# Patient Record
Sex: Female | Born: 1970 | Race: Black or African American | Hispanic: No | Marital: Married | Smoking: Never smoker
Health system: Southern US, Community
[De-identification: ages and names within clinical notes are randomized; demographics above are authoritative.]

## PROBLEM LIST (undated history)

## (undated) DIAGNOSIS — K219 Gastro-esophageal reflux disease without esophagitis: Secondary | ICD-10-CM

## (undated) DIAGNOSIS — K5190 Ulcerative colitis, unspecified, without complications: Secondary | ICD-10-CM

## (undated) DIAGNOSIS — I10 Essential (primary) hypertension: Secondary | ICD-10-CM

## (undated) DIAGNOSIS — E782 Mixed hyperlipidemia: Secondary | ICD-10-CM

## (undated) HISTORY — DX: Ulcerative colitis, unspecified, without complications: K51.90

## (undated) HISTORY — DX: Morbid (severe) obesity due to excess calories: E66.01

## (undated) HISTORY — DX: Mixed hyperlipidemia: E78.2

## (undated) HISTORY — PX: CHOLECYSTECTOMY: SHX55

## (undated) HISTORY — PX: TUBAL LIGATION: SHX77

---

## 1994-06-28 HISTORY — PX: TUBAL LIGATION: SHX77

## 2006-10-19 ENCOUNTER — Emergency Department (HOSPITAL_COMMUNITY): Admission: EM | Admit: 2006-10-19 | Discharge: 2006-10-19 | Payer: Self-pay | Admitting: Emergency Medicine

## 2010-08-29 ENCOUNTER — Emergency Department (HOSPITAL_BASED_OUTPATIENT_CLINIC_OR_DEPARTMENT_OTHER)
Admission: EM | Admit: 2010-08-29 | Discharge: 2010-08-30 | Disposition: A | Discharge status: Home / Self Care | Payer: 59 | Attending: Emergency Medicine | Admitting: Emergency Medicine

## 2010-08-29 ENCOUNTER — Emergency Department (INDEPENDENT_AMBULATORY_CARE_PROVIDER_SITE_OTHER): Payer: 59

## 2010-08-29 DIAGNOSIS — R110 Nausea: Secondary | ICD-10-CM | POA: Insufficient documentation

## 2010-08-29 DIAGNOSIS — R079 Chest pain, unspecified: Secondary | ICD-10-CM

## 2010-08-29 DIAGNOSIS — E669 Obesity, unspecified: Secondary | ICD-10-CM | POA: Insufficient documentation

## 2010-08-29 DIAGNOSIS — Z79899 Other long term (current) drug therapy: Secondary | ICD-10-CM | POA: Insufficient documentation

## 2010-08-29 DIAGNOSIS — E785 Hyperlipidemia, unspecified: Secondary | ICD-10-CM | POA: Insufficient documentation

## 2010-12-31 ENCOUNTER — Emergency Department (HOSPITAL_BASED_OUTPATIENT_CLINIC_OR_DEPARTMENT_OTHER)
Admission: EM | Admit: 2010-12-31 | Discharge: 2011-01-01 | Disposition: A | Discharge status: Home / Self Care | Payer: 59 | Attending: Emergency Medicine | Admitting: Emergency Medicine

## 2010-12-31 DIAGNOSIS — R599 Enlarged lymph nodes, unspecified: Secondary | ICD-10-CM | POA: Insufficient documentation

## 2010-12-31 DIAGNOSIS — E785 Hyperlipidemia, unspecified: Secondary | ICD-10-CM | POA: Insufficient documentation

## 2010-12-31 DIAGNOSIS — M7989 Other specified soft tissue disorders: Secondary | ICD-10-CM | POA: Insufficient documentation

## 2011-01-13 ENCOUNTER — Emergency Department (HOSPITAL_COMMUNITY)
Admission: EM | Admit: 2011-01-13 | Discharge: 2011-01-13 | Disposition: A | Discharge status: Home / Self Care | Payer: 59 | Attending: Emergency Medicine | Admitting: Emergency Medicine

## 2011-01-13 ENCOUNTER — Emergency Department (HOSPITAL_COMMUNITY): Payer: 59

## 2011-01-13 DIAGNOSIS — F411 Generalized anxiety disorder: Secondary | ICD-10-CM | POA: Insufficient documentation

## 2011-01-13 DIAGNOSIS — R000 Tachycardia, unspecified: Secondary | ICD-10-CM | POA: Insufficient documentation

## 2011-01-13 DIAGNOSIS — M542 Cervicalgia: Secondary | ICD-10-CM | POA: Insufficient documentation

## 2011-01-13 DIAGNOSIS — R220 Localized swelling, mass and lump, head: Secondary | ICD-10-CM | POA: Insufficient documentation

## 2011-01-13 LAB — POCT I-STAT, CHEM 8
BUN: 13 mg/dL (ref 6–23)
Chloride: 104 mEq/L (ref 96–112)
HCT: 36 % (ref 36.0–46.0)
Sodium: 140 mEq/L (ref 135–145)
TCO2: 25 mmol/L (ref 0–100)

## 2011-01-13 LAB — CBC
HCT: 31.5 % — ABNORMAL LOW (ref 36.0–46.0)
Hemoglobin: 9.8 g/dL — ABNORMAL LOW (ref 12.0–15.0)
MCH: 18.8 pg — ABNORMAL LOW (ref 26.0–34.0)
MCHC: 31.1 g/dL (ref 30.0–36.0)
MCV: 60.6 fL — ABNORMAL LOW (ref 78.0–100.0)

## 2011-01-13 LAB — DIFFERENTIAL
Band Neutrophils: 0 % (ref 0–10)
Basophils Absolute: 0 10*3/uL (ref 0.0–0.1)
Basophils Relative: 0 % (ref 0–1)
Blasts: 0 %
Eosinophils Absolute: 0.4 10*3/uL (ref 0.0–0.7)
Lymphs Abs: 2.2 10*3/uL (ref 0.7–4.0)
Metamyelocytes Relative: 0 %
Monocytes Relative: 7 % (ref 3–12)
Neutro Abs: 4.5 10*3/uL (ref 1.7–7.7)

## 2011-01-14 LAB — T4, FREE: Free T4: 1.17 ng/dL (ref 0.80–1.80)

## 2012-03-04 ENCOUNTER — Encounter (HOSPITAL_BASED_OUTPATIENT_CLINIC_OR_DEPARTMENT_OTHER): Payer: Self-pay | Admitting: *Deleted

## 2012-03-04 ENCOUNTER — Emergency Department (HOSPITAL_BASED_OUTPATIENT_CLINIC_OR_DEPARTMENT_OTHER)
Admission: EM | Admit: 2012-03-04 | Discharge: 2012-03-04 | Disposition: A | Discharge status: Home / Self Care | Payer: 59 | Attending: Emergency Medicine | Admitting: Emergency Medicine

## 2012-03-04 ENCOUNTER — Emergency Department (HOSPITAL_BASED_OUTPATIENT_CLINIC_OR_DEPARTMENT_OTHER): Payer: 59

## 2012-03-04 DIAGNOSIS — E785 Hyperlipidemia, unspecified: Secondary | ICD-10-CM | POA: Insufficient documentation

## 2012-03-04 DIAGNOSIS — R079 Chest pain, unspecified: Secondary | ICD-10-CM

## 2012-03-04 DIAGNOSIS — I10 Essential (primary) hypertension: Secondary | ICD-10-CM | POA: Insufficient documentation

## 2012-03-04 DIAGNOSIS — D509 Iron deficiency anemia, unspecified: Secondary | ICD-10-CM | POA: Insufficient documentation

## 2012-03-04 DIAGNOSIS — E119 Type 2 diabetes mellitus without complications: Secondary | ICD-10-CM | POA: Insufficient documentation

## 2012-03-04 DIAGNOSIS — R0789 Other chest pain: Secondary | ICD-10-CM | POA: Insufficient documentation

## 2012-03-04 DIAGNOSIS — K219 Gastro-esophageal reflux disease without esophagitis: Secondary | ICD-10-CM | POA: Insufficient documentation

## 2012-03-04 LAB — CBC WITH DIFFERENTIAL/PLATELET
Band Neutrophils: 0 % (ref 0–10)
Basophils Absolute: 0 10*3/uL (ref 0.0–0.1)
Basophils Relative: 0 % (ref 0–1)
Eosinophils Absolute: 0.5 10*3/uL (ref 0.0–0.7)
Eosinophils Relative: 8 % — ABNORMAL HIGH (ref 0–5)
HCT: 28.5 % — ABNORMAL LOW (ref 36.0–46.0)
Hemoglobin: 9 g/dL — ABNORMAL LOW (ref 12.0–15.0)
MCH: 17.6 pg — ABNORMAL LOW (ref 26.0–34.0)
MCHC: 31.6 g/dL (ref 30.0–36.0)
MCV: 55.7 fL — ABNORMAL LOW (ref 78.0–100.0)
Metamyelocytes Relative: 0 %
Myelocytes: 0 %
Promyelocytes Absolute: 0 %
RBC: 5.12 MIL/uL — ABNORMAL HIGH (ref 3.87–5.11)

## 2012-03-04 MED ORDER — ACETAMINOPHEN 325 MG PO TABS
650.0000 mg | ORAL_TABLET | Freq: Once | ORAL | Status: AC
  Administered 2012-03-04: 650 mg via ORAL
  Filled 2012-03-04: qty 2

## 2012-03-04 MED ORDER — ESOMEPRAZOLE MAGNESIUM 40 MG PO CPDR: 40.0000 mg | DELAYED_RELEASE_CAPSULE | Freq: Every day | ORAL | Status: DC

## 2012-03-04 MED ORDER — GI COCKTAIL ~~LOC~~
30.0000 mL | Freq: Once | ORAL | Status: AC
  Administered 2012-03-04: 30 mL via ORAL
  Filled 2012-03-04: qty 30

## 2012-03-04 MED ORDER — ONDANSETRON 8 MG PO TBDP
8.0000 mg | ORAL_TABLET | Freq: Once | ORAL | Status: AC
  Administered 2012-03-04: 8 mg via ORAL
  Filled 2012-03-04: qty 1

## 2012-03-04 MED ORDER — FERROUS SULFATE 325 (65 FE) MG PO TABS: 325.0000 mg | ORAL_TABLET | Freq: Every day | ORAL | Status: DC

## 2012-03-04 NOTE — ED Provider Notes (Signed)
History   This chart was scribed for Suzanne Numbers, MD scribed by Magnus Sinning. The patient was seen in room MH05/MH05 at 17:53   CSN: [id number]  Arrival date & time 03/04/12  1653     Chief Complaint  Patient presents with  . Chest Pain    (Consider location/radiation/quality/duration/timing/severity/associated sxs/prior treatment) HPI Suzanne Armstrong is a 41 y.o. female who presents to the Emergency Department complaining of constant moderate CP, onset 2 days ago, but worsening today with associated lightheadedness, severe HA, nausea, and a burning sensatio. States she was just cooking dinner when her CP began and currently rates the pain a 7/10 and describes the pain as a heart burn. Explains she has only taken Mylanta this morning, with no relief, and says she has only eaten chicken noodle soup today. She denies vomiting, change in pain with deep breathing, hx of DM, HTN, hyperlipidemia, recent sick contact exposure, fever, cough, or family hx of MI before 50. Patient does report hx of anemia and heavy periods, but states she has never had to receive blood for treatment. Patient states she is not a smoker and explains her husband does smoke, but not in the home.    History reviewed. No pertinent past medical history.  Past Surgical History  Procedure Date  . Cholecystectomy   . Tubal ligation     History reviewed. No pertinent family history.  History  Substance Use Topics  . Smoking status: Never Smoker   . Smokeless tobacco: Not on file  . Alcohol Use: No   Review of Systems  Constitutional: Negative for fever.  HENT: Positive for sore throat.   Respiratory: Negative for cough.   Cardiovascular: Positive for chest pain.  Gastrointestinal: Positive for nausea. Negative for vomiting.  Neurological: Positive for light-headedness and headaches.  All other systems reviewed and are negative.    Allergies  Other and Strawberry  Home Medications   Current Outpatient  Rx  Name Route Sig Dispense Refill  . ALUM & MAG HYDROXIDE-SIMETH 200-200-20 MG/5ML PO SUSP Oral Take 15 mLs by mouth every 6 (six) hours as needed. FOR INDIGESTION    . ASPIRIN-ACETAMINOPHEN-CAFFEINE 250-250-65 MG PO TABS Oral Take 1 tablet by mouth every 6 (six) hours as needed. FOR HEADACHE    . ESOMEPRAZOLE MAGNESIUM 40 MG PO CPDR Oral Take 40 mg by mouth daily.      BP 133/92  Pulse 92  Temp 97.9 F (36.6 C) (Oral)  Resp 20  Ht 5' (1.524 m)  Wt 185 lb (83.915 kg)  BMI 36.13 kg/m2  SpO2 98%  LMP 02/20/2012  Physical Exam  Nursing note and vitals reviewed. GEN: Well-developed, well-nourished female  HEENT: Atraumatic, normocephalic. Oropharynx clear without erythema EYES: PERRLA BL, no scleral icterus. NECK: Trachea midline, no meningismus CV: regular rate and rhythm. No murmurs, rubs, or gallops PULM: No respiratory distress.  No crackles, wheezes, or rales. GI: soft, non-tender. No guarding, rebound, or tenderness. + bowel sounds  GU: deferred Neuro: cranial nerves grossly 2-12 intact, no abnormalities of strength or sensation, A and O x 3 MSK: Patient moves all 4 extremities symmetrically, no deformity, edema, or injury noted Skin: No rashes petechiae, purpura, or jaundice Psych: no abnormality of mood  ED Course  Procedures (including critical care time)  Indication: chest pain Please note this EKG was reviewed extemporaneously by myself.   Date: 03/05/2012  Rate: 97  Rhythm: normal sinus rhythm  QRS Axis: normal  Intervals: normal  ST/T Wave abnormalities: nonspecific T wave  changes  Conduction Disutrbances:none  Narrative Interpretation: T wave inversion in aVL  Old EKG Reviewed: none available       DIAGNOSTIC STUDIES: Oxygen Saturation is 98% on room air, normal by my interpretation.    COORDINATION OF CARE: 17:56: Provides intent to treat as a reflux, due to information provided. Orders placed for 8 mg Zofran ODT and Gi Cocktail. Patient agreeable  at this time. 17:57: Physical exam performed   Labs Reviewed  CBC WITH DIFFERENTIAL   Dg Chest 2 View  03/04/2012  *RADIOLOGY REPORT*  Clinical Data: 41 year old female right side chest pain.  CHEST - 2 VIEW  Comparison: 08/29/2010.  Findings: Stable lung volumes.  Cardiac size and mediastinal contours are within normal limits.  Visualized tracheal air column is within normal limits.  The lungs remain clear aside from mild crowding markings.  No pneumothorax or effusion. No osseous abnormality identified.  IMPRESSION: No acute cardiopulmonary abnormality.   Original Report Authenticated By: Ulla Potash III, M.D.      1. GERD (gastroesophageal reflux disease)   2. Microcytic anemia   3. Chest pain       MDM  Patient was evaluated by myself and found to have presentation most consistent with GERD.  ECG showed no findings concerning for atypical presentation of ACS and CXR was without PNA or cardiomegaly.  Symptoms were treated with GI cocktail and zofran with improvement but without complete resolution.  CBC showed anemia and last lab was noted to have Hbg of 9.8 in 12/2010 while today's value was 9.0.  Patient was not felt to have symptoms as a result of symptomatic anemia.  Given low MCV iron supplement prescribed.  Patient prescribed nexium as she has had improvement with this in the past and no improvement with OTC pepcid.  Patient advised to follow-up with a PCP and d/c'ed in good condition.    I personally performed the services described in this documentation, which was scribed in my presence. The recorded information has been reviewed and considered.          Suzanne Numbers, MD 03/09/12 [phone]

## 2012-03-04 NOTE — ED Notes (Addendum)
Pt states her throat has been burning and she has ahd epigastric discomfort x 1 week. Took some of her mom's Nexium when this happened before and it helped. Stopped taking it though and s/s returned. Now C/O CP, SHOB and weakness this week. EKG being done at triage.

## 2013-01-15 ENCOUNTER — Encounter (HOSPITAL_BASED_OUTPATIENT_CLINIC_OR_DEPARTMENT_OTHER): Payer: Self-pay | Admitting: *Deleted

## 2013-01-15 ENCOUNTER — Emergency Department (HOSPITAL_BASED_OUTPATIENT_CLINIC_OR_DEPARTMENT_OTHER): Payer: 59

## 2013-01-15 ENCOUNTER — Emergency Department (HOSPITAL_BASED_OUTPATIENT_CLINIC_OR_DEPARTMENT_OTHER)
Admission: EM | Admit: 2013-01-15 | Discharge: 2013-01-15 | Disposition: A | Discharge status: Home / Self Care | Payer: 59 | Attending: Emergency Medicine | Admitting: Emergency Medicine

## 2013-01-15 DIAGNOSIS — J189 Pneumonia, unspecified organism: Secondary | ICD-10-CM

## 2013-01-15 DIAGNOSIS — R5381 Other malaise: Secondary | ICD-10-CM | POA: Insufficient documentation

## 2013-01-15 DIAGNOSIS — Z79899 Other long term (current) drug therapy: Secondary | ICD-10-CM | POA: Insufficient documentation

## 2013-01-15 DIAGNOSIS — J159 Unspecified bacterial pneumonia: Secondary | ICD-10-CM | POA: Insufficient documentation

## 2013-01-15 DIAGNOSIS — R5383 Other fatigue: Secondary | ICD-10-CM | POA: Insufficient documentation

## 2013-01-15 LAB — CBC WITH DIFFERENTIAL/PLATELET
Basophils Relative: 0 % (ref 0–1)
Eosinophils Absolute: 0.6 10*3/uL (ref 0.0–0.7)
Eosinophils Relative: 6 % — ABNORMAL HIGH (ref 0–5)
Hemoglobin: 9.9 g/dL — ABNORMAL LOW (ref 12.0–15.0)
Lymphocytes Relative: 27 % (ref 12–46)
MCH: 18.6 pg — ABNORMAL LOW (ref 26.0–34.0)
Monocytes Absolute: 0.5 10*3/uL (ref 0.1–1.0)
Neutrophils Relative %: 62 % (ref 43–77)
Platelets: 435 10*3/uL — ABNORMAL HIGH (ref 150–400)
RBC: 5.31 MIL/uL — ABNORMAL HIGH (ref 3.87–5.11)

## 2013-01-15 LAB — BASIC METABOLIC PANEL
CO2: 24 mEq/L (ref 19–32)
Calcium: 9.6 mg/dL (ref 8.4–10.5)
GFR calc non Af Amer: >90 mL/min (ref >90)
Glucose, Bld: 109 mg/dL — ABNORMAL HIGH (ref 70–99)
Potassium: 3.6 mEq/L (ref 3.5–5.1)
Sodium: 135 mEq/L (ref 135–145)

## 2013-01-15 LAB — PRO B NATRIURETIC PEPTIDE: Pro B Natriuretic peptide (BNP): 8 pg/mL (ref 0–125)

## 2013-01-15 LAB — TROPONIN I: Troponin I: <0.3 ng/mL (ref <0.30)

## 2013-01-15 LAB — D-DIMER, QUANTITATIVE: D-Dimer, Quant: 1.79 ug/mL-FEU — ABNORMAL HIGH (ref 0.00–0.48)

## 2013-01-15 MED ORDER — ACETAMINOPHEN 325 MG PO TABS
ORAL_TABLET | ORAL | Status: AC
  Filled 2013-01-15: qty 2

## 2013-01-15 MED ORDER — IOHEXOL 350 MG/ML SOLN
100.0000 mL | Freq: Once | INTRAVENOUS | Status: AC | PRN
  Administered 2013-01-15: 100 mL via INTRAVENOUS

## 2013-01-15 MED ORDER — ACETAMINOPHEN 325 MG PO TABS
650.0000 mg | ORAL_TABLET | Freq: Once | ORAL | Status: AC
  Administered 2013-01-15: 650 mg via ORAL

## 2013-01-15 MED ORDER — DEXTROSE 5 % IV SOLN
1.0000 g | Freq: Once | INTRAVENOUS | Status: AC
  Administered 2013-01-15: 1 g via INTRAVENOUS
  Filled 2013-01-15: qty 10

## 2013-01-15 MED ORDER — AZITHROMYCIN 250 MG PO TABS: ORAL_TABLET | ORAL | Status: DC

## 2013-01-15 NOTE — ED Provider Notes (Signed)
History     This chart was scribed for Rolan Bucco, MD by Jiles Prows, ED Scribe. The patient was seen in room MH10/MH10 and the patient's care was started at 6:50 PM.   CSN: [id number] Arrival date & time 01/15/13  1830   Chief Complaint  Patient presents with  . Shortness of Breath   The history is provided by the patient and medical records. No language interpreter was used.   HPI Comments: Suzanne Armstrong is a 42 y.o. female who presents to the Emergency Department complaining of moderate, intermittent shortness of breath onset Friday.  She reports feeling like she cannot carry on a conversation citing "weird feeling" when she tries to breathe.  She reports exhaustion yesterday and states that she was afraid to go to sleep last night.  Pt complains of exacerbation with exertion.  She denies asthma, cough, URI symptoms, or swelling in legs, pain in calves, recent travel, recent surgery, headache, diaphoresis, fever, chills, nausea, vomiting, diarrhea, weakness, cough, SOB and any other pain.  Pt is speaking in complete sentences.    Pt reports MRI in April that showed cysts on ovaries.  History reviewed. No pertinent past medical history. Past Surgical History  Procedure Laterality Date  . Cholecystectomy    . Tubal ligation     No family history on file. History  Substance Use Topics  . Smoking status: Never Smoker   . Smokeless tobacco: Not on file  . Alcohol Use: No   OB History   Grav Para Term Preterm Abortions TAB SAB Ect Mult Living                 Review of Systems  Constitutional: Positive for fatigue. Negative for fever, chills and diaphoresis.  HENT: Negative for congestion, rhinorrhea and sneezing.   Eyes: Negative.   Respiratory: Positive for shortness of breath. Negative for cough and chest tightness.   Cardiovascular: Negative for chest pain and leg swelling.  Gastrointestinal: Negative for nausea, vomiting, abdominal pain, diarrhea and blood in stool.   Genitourinary: Negative for frequency, hematuria, flank pain and difficulty urinating.  Musculoskeletal: Negative for back pain and arthralgias.  Skin: Negative for rash.  Neurological: Negative for dizziness, speech difficulty, weakness, numbness and headaches.  All other systems reviewed and are negative.    Allergies  Other and Strawberry  Home Medications   Current Outpatient Rx  Name  Route  Sig  Dispense  Refill  . alum & mag hydroxide-simeth (MAALOX/MYLANTA) 200-200-20 MG/5ML suspension   Oral   Take 15 mLs by mouth every 6 (six) hours as needed. FOR INDIGESTION         . aspirin-acetaminophen-caffeine (EXCEDRIN MIGRAINE) 250-250-65 MG per tablet   Oral   Take 1 tablet by mouth every 6 (six) hours as needed. FOR HEADACHE         . azithromycin (ZITHROMAX Z-PAK) 250 MG tablet      2 po day one, then 1 daily x 4 days   5 tablet   0   . esomeprazole (NEXIUM) 40 MG capsule   Oral   Take 40 mg by mouth daily.         Marland Kitchen esomeprazole (NEXIUM) 40 MG capsule   Oral   Take 1 capsule (40 mg total) by mouth daily.   30 capsule   0   . ferrous sulfate 325 (65 FE) MG tablet   Oral   Take 1 tablet (325 mg total) by mouth daily.   30 tablet  0    BP 137/78  Pulse 85  Temp(Src) 98.4 F (36.9 C) (Oral)  Resp 18  Ht 5' (1.524 m)  Wt 212 lb (96.163 kg)  BMI 41.4 kg/m2  SpO2 100% Physical Exam  Constitutional: She is oriented to person, place, and time. She appears well-developed and well-nourished.  HENT:  Head: Normocephalic and atraumatic.  Eyes: Pupils are equal, round, and reactive to light.  Neck: Normal range of motion. Neck supple.  Cardiovascular: Normal rate, regular rhythm and normal heart sounds.   Pulmonary/Chest: Effort normal and breath sounds normal. No respiratory distress. She has no wheezes. She has no rales. She exhibits no tenderness.  Abdominal: Soft. Bowel sounds are normal. There is no tenderness. There is no rebound and no guarding.   Musculoskeletal: Normal range of motion. She exhibits no edema.  Lymphadenopathy:    She has no cervical adenopathy.  Neurological: She is alert and oriented to person, place, and time.  Skin: Skin is warm and dry. No rash noted.  Psychiatric: She has a normal mood and affect.    ED Course  Procedures (including critical care time) DIAGNOSTIC STUDIES: Oxygen Saturation is 100% on RA, normal by my interpretation.    COORDINATION OF CARE: 6:54 PM - Discussed ED treatment with pt at bedside including chest x-ray and blood work and pt agrees.   Results for orders placed during the hospital encounter of 01/15/13  CBC WITH DIFFERENTIAL      Result Value Range   WBC 9.5  4.0 - 10.5 K/uL   RBC 5.31 (*) 3.87 - 5.11 MIL/uL   Hemoglobin 9.9 (*) 12.0 - 15.0 g/dL   HCT 16.1 (*) 09.6 - 04.5 %   MCV 58.9 (*) 78.0 - 100.0 fL   MCH 18.6 (*) 26.0 - 34.0 pg   MCHC 31.6  30.0 - 36.0 g/dL   RDW 40.9 (*) 81.1 - 91.4 %   Platelets 435 (*) 150 - 400 K/uL   Neutrophils Relative % 62  43 - 77 %   Lymphocytes Relative 27  12 - 46 %   Monocytes Relative 5  3 - 12 %   Eosinophils Relative 6 (*) 0 - 5 %   Basophils Relative 0  0 - 1 %   Neutro Abs 5.8  1.7 - 7.7 K/uL   Lymphs Abs 2.6  0.7 - 4.0 K/uL   Monocytes Absolute 0.5  0.1 - 1.0 K/uL   Eosinophils Absolute 0.6  0.0 - 0.7 K/uL   Basophils Absolute 0.0  0.0 - 0.1 K/uL   RBC Morphology POLYCHROMASIA PRESENT    BASIC METABOLIC PANEL      Result Value Range   Sodium 135  135 - 145 mEq/L   Potassium 3.6  3.5 - 5.1 mEq/L   Chloride 100  96 - 112 mEq/L   CO2 24  19 - 32 mEq/L   Glucose, Bld 109 (*) 70 - 99 mg/dL   BUN 12  6 - 23 mg/dL   Creatinine, Ser 7.82  0.50 - 1.10 mg/dL   Calcium 9.6  8.4 - 95.6 mg/dL   GFR calc non Af Amer >90  >90 mL/min   GFR calc Af Amer >90  >90 mL/min  TROPONIN I      Result Value Range   Troponin I <0.30  <0.30 ng/mL  PRO B NATRIURETIC PEPTIDE      Result Value Range   Pro B Natriuretic peptide (BNP) 8.0  0 -  125 pg/mL  D-DIMER,  QUANTITATIVE      Result Value Range   D-Dimer, Quant 1.79 (*) 0.00 - 0.48 ug/mL-FEU   Dg Chest 2 View  01/15/2013   *RADIOLOGY REPORT*  Clinical Data: Shortness of breath  CHEST - 2 VIEW  Comparison: March 04, 2012.  Findings: There is a small area of patchy infiltrate in the left base.  The lungs are otherwise clear.  Heart size and pulmonary vascularity are normal.  No pneumothorax.  No adenopathy.  No bone lesions.  IMPRESSION: Small area of infiltrate left base.  Lungs otherwise clear.   Original Report Authenticated By: Bretta Bang, M.D.   Ct Angio Chest Pe W/cm &/or Wo Cm  01/15/2013   *RADIOLOGY REPORT*  Clinical Data: Shortness of breath  CT ANGIOGRAPHY CHEST  Technique:  Multidetector CT imaging of the chest using the standard protocol during bolus administration of intravenous contrast. Multiplanar reconstructed images including MIPs were obtained and reviewed to evaluate the vascular anatomy.  Contrast: OMNIPAQUE IOHEXOL 350 MG/ML SOLN  Comparison: Chest radiograph January 15, 2013  Findings:  There is no demonstrable pulmonary embolus.  There is no thoracic aortic aneurysm or dissection.  There is patchy bibasilar infiltrate, slightly more on the left than on the right.  Elsewhere, lungs are clear.  There is no appreciable thoracic adenopathy.  There is a mild degree of residual thymic tissue, a finding that may be within normal limits.  Pericardium is not thickened. There is a small hiatal hernia.  Visualized upper abdominal structures appear within normal limits.  There are no blastic or lytic bone lesions.  There is mild degenerative change in the thoracic spine.  Thyroid appears within normal limits.  IMPRESSION: Patchy bibasilar infiltrate, slightly more on the left than on the right.  No demonstrable pulmonary embolus.  Small hiatal hernia.   Original Report Authenticated By: Bretta Bang, M.D.    Date: 01/15/2013  Rate: 82  Rhythm: normal sinus  rhythm  QRS Axis: normal  Intervals: normal  ST/T Wave abnormalities: normal  Conduction Disutrbances:none  Narrative Interpretation:   Old EKG Reviewed: unchanged      1. Community acquired pneumonia     MDM  Patient has no evidence of pulmonary embolus. Her oxygen saturation is normal. She is otherwise well-appearing and can be treated as an outpatient. She was given dose Rocephin here in the ED as well as a prescription for Zithromax. She was encouraged to followup with her primary care physician for recheck within the next week. She is going to try to get in with [facility]. She was advised to return here for symptoms worsen. She has a history of chronic anemia and her hemoglobin and is unchanged from her baseline. Her last value was 9.0. I personally performed the services described in this documentation, which was scribed in my presence.  The recorded information has been reviewed and considered.    Rolan Bucco, MD 01/15/13 2113

## 2013-01-15 NOTE — ED Notes (Signed)
States like she is having to struggle getting a breath. Symptoms started 3 days ago. Speaking in complete sentences at triage. No respiratory distress.

## 2013-06-28 HISTORY — PX: LAPAROSCOPIC CHOLECYSTECTOMY: SUR755

## 2014-04-04 ENCOUNTER — Encounter (HOSPITAL_BASED_OUTPATIENT_CLINIC_OR_DEPARTMENT_OTHER): Payer: Self-pay | Admitting: Emergency Medicine

## 2014-04-04 ENCOUNTER — Emergency Department (HOSPITAL_BASED_OUTPATIENT_CLINIC_OR_DEPARTMENT_OTHER)
Admission: EM | Admit: 2014-04-04 | Discharge: 2014-04-04 | Disposition: A | Discharge status: Home / Self Care | Payer: 59 | Attending: Emergency Medicine | Admitting: Emergency Medicine

## 2014-04-04 DIAGNOSIS — J029 Acute pharyngitis, unspecified: Secondary | ICD-10-CM | POA: Diagnosis present

## 2014-04-04 DIAGNOSIS — H9319 Tinnitus, unspecified ear: Secondary | ICD-10-CM | POA: Diagnosis not present

## 2014-04-04 DIAGNOSIS — M542 Cervicalgia: Secondary | ICD-10-CM | POA: Diagnosis not present

## 2014-04-04 DIAGNOSIS — J0191 Acute recurrent sinusitis, unspecified: Secondary | ICD-10-CM | POA: Diagnosis not present

## 2014-04-04 DIAGNOSIS — R05 Cough: Secondary | ICD-10-CM | POA: Diagnosis not present

## 2014-04-04 DIAGNOSIS — Z79899 Other long term (current) drug therapy: Secondary | ICD-10-CM | POA: Diagnosis not present

## 2014-04-04 DIAGNOSIS — J019 Acute sinusitis, unspecified: Secondary | ICD-10-CM

## 2014-04-04 LAB — RAPID STREP SCREEN (MED CTR MEBANE ONLY): Streptococcus, Group A Screen (Direct): NEGATIVE

## 2014-04-04 MED ORDER — MOMETASONE FUROATE 50 MCG/ACT NA SUSP: 2.0000 | Freq: Every day | NASAL | Status: DC

## 2014-04-04 MED ORDER — AMOXICILLIN 500 MG PO CAPS: 500.0000 mg | ORAL_CAPSULE | Freq: Three times a day (TID) | ORAL | Status: DC

## 2014-04-04 NOTE — ED Notes (Signed)
Sore throat and swelling of her neck off and on for 3 weeks.

## 2014-04-04 NOTE — Discharge Instructions (Signed)

## 2014-04-04 NOTE — ED Provider Notes (Signed)
CSN: [id number]     Arrival date & time 04/04/14  1859 History  This chart was scribed for Gilda Creasehristopher J. Lui Bellis, * by Jarvis Morganaylor Ferguson, ED Scribe. This patient was seen in room MHFT2/MHFT2 and the patient's care was started at 7:42 PM.    Chief Complaint  Patient presents with  . Sore Throat      The history is provided by the patient. No language interpreter was used.   HPI Comments: Suzanne Armstrong is a 43 y.o. female who presents to the Emergency Department complaining of intermittent, mild, sore throat for 2 weeks. Pt notes some associated congestion, cough and neck pain. Pt feels like the side of her neck is swollen. She has not taken anything for her symptoms.Pt states that the sore throat is exacerbated when she tries to eat. She denies any neck stiffness, nausea, vomiting, diarrhea, fever, chills, shortness of breath, or trouble swallowing   No past medical history on file. Past Surgical History  Procedure Laterality Date  . Cholecystectomy    . Tubal ligation     No family history on file. History  Substance Use Topics  . Smoking status: Never Smoker   . Smokeless tobacco: Not on file  . Alcohol Use: No   OB History   Grav Para Term Preterm Abortions TAB SAB Ect Mult Living                 Review of Systems  Constitutional: Negative for fever and chills.  HENT: Positive for congestion and tinnitus. Negative for trouble swallowing.   Respiratory: Positive for cough. Negative for shortness of breath.   Gastrointestinal: Negative for nausea, vomiting and diarrhea.  Musculoskeletal: Positive for neck pain. Negative for neck stiffness.  All other systems reviewed and are negative.     Allergies  Other and Strawberry  Home Medications   Prior to Admission medications   Medication Sig Start Date End Date Taking? Authorizing Provider  alum & mag hydroxide-simeth (MAALOX/MYLANTA) 200-200-20 MG/5ML suspension Take 15 mLs by mouth every 6 (six) hours as needed. FOR  INDIGESTION    Historical Provider, MD  aspirin-acetaminophen-caffeine (EXCEDRIN MIGRAINE) [phone] MG per tablet Take 1 tablet by mouth every 6 (six) hours as needed. FOR HEADACHE    Historical Provider, MD  azithromycin (ZITHROMAX Z-PAK) 250 MG tablet 2 po day one, then 1 daily x 4 days 01/15/13   Rolan BuccoMelanie Belfi, MD  esomeprazole (NEXIUM) 40 MG capsule Take 40 mg by mouth daily.    Historical Provider, MD  esomeprazole (NEXIUM) 40 MG capsule Take 1 capsule (40 mg total) by mouth daily. 03/04/12 03/04/13  Cyndra NumbersMeagan Hunt, MD  ferrous sulfate 325 (65 FE) MG tablet Take 1 tablet (325 mg total) by mouth daily. 03/04/12 03/04/13  Cyndra NumbersMeagan Hunt, MD   Triage Vitals: BP 139/94  Pulse 83  Temp(Src) 98.4 F (36.9 C) (Oral)  Resp 20  Ht 5\' 1"  (1.549 m)  Wt 215 lb (97.523 kg)  BMI 40.64 kg/m2  SpO2 100%  LMP 03/20/2014  Physical Exam  Constitutional: She is oriented to person, place, and time. She appears well-developed and well-nourished. No distress.  HENT:  Head: Normocephalic and atraumatic.  Right Ear: Hearing normal.  Left Ear: Hearing normal.  Nose: Nose normal.  Mouth/Throat: Oropharynx is clear and moist and mucous membranes are normal.  Eyes: Conjunctivae and EOM are normal. Pupils are equal, round, and reactive to light.  Neck: Normal range of motion. Neck supple.  Cardiovascular: Regular rhythm, S1 normal and S2 normal.  Exam reveals no gallop and no friction rub.   No murmur heard. Pulmonary/Chest: Effort normal and breath sounds normal. No respiratory distress. She exhibits no tenderness.  Abdominal: Soft. Normal appearance and bowel sounds are normal. There is no hepatosplenomegaly. There is no tenderness. There is no rebound, no guarding, no tenderness at McBurney's point and negative Murphy's sign. No hernia.  Musculoskeletal: Normal range of motion.  Neurological: She is alert and oriented to person, place, and time. She has normal strength. No cranial nerve deficit or sensory deficit.  Coordination normal. GCS eye subscore is 4. GCS verbal subscore is 5. GCS motor subscore is 6.  Skin: Skin is warm, dry and intact. No rash noted. No cyanosis.  Psychiatric: She has a normal mood and affect. Her speech is normal and behavior is normal. Thought content normal.    ED Course  Procedures (including critical care time)   DIAGNOSTIC STUDIES: Oxygen Saturation is 100% on RA, normal by my interpretation.    COORDINATION OF CARE: 7:58  PM- Will order rapid strep screen and culture. Pt advised of plan for treatment and pt agrees.     Labs Review Labs Reviewed  RAPID STREP SCREEN  CULTURE, GROUP A STREP    Imaging Review No results found.   EKG Interpretation None      MDM   Final diagnoses:  None   sinusitis  Presents to the ER for evaluation of intermittent sore throat for 2 weeks. Examination was unremarkable. She does report some sinus congestion. We'll empirically treat for possible sinus infection.  I personally performed the services described in this documentation, which was scribed in my presence. The recorded information has been reviewed and is accurate.     Gilda Crease, MD 04/04/14 2007

## 2014-04-06 LAB — CULTURE, GROUP A STREP

## 2015-01-22 ENCOUNTER — Emergency Department (HOSPITAL_BASED_OUTPATIENT_CLINIC_OR_DEPARTMENT_OTHER)
Admission: EM | Admit: 2015-01-22 | Discharge: 2015-01-22 | Disposition: A | Discharge status: Home / Self Care | Payer: 59 | Attending: Emergency Medicine | Admitting: Emergency Medicine

## 2015-01-22 ENCOUNTER — Emergency Department (HOSPITAL_BASED_OUTPATIENT_CLINIC_OR_DEPARTMENT_OTHER): Payer: 59

## 2015-01-22 ENCOUNTER — Encounter (HOSPITAL_BASED_OUTPATIENT_CLINIC_OR_DEPARTMENT_OTHER): Payer: Self-pay | Admitting: *Deleted

## 2015-01-22 DIAGNOSIS — Z792 Long term (current) use of antibiotics: Secondary | ICD-10-CM | POA: Diagnosis not present

## 2015-01-22 DIAGNOSIS — Z7951 Long term (current) use of inhaled steroids: Secondary | ICD-10-CM | POA: Insufficient documentation

## 2015-01-22 DIAGNOSIS — R002 Palpitations: Secondary | ICD-10-CM | POA: Diagnosis not present

## 2015-01-22 DIAGNOSIS — K219 Gastro-esophageal reflux disease without esophagitis: Secondary | ICD-10-CM | POA: Diagnosis not present

## 2015-01-22 DIAGNOSIS — R0602 Shortness of breath: Secondary | ICD-10-CM | POA: Diagnosis not present

## 2015-01-22 LAB — CBC
HEMATOCRIT: 32.3 % — AB (ref 36.0–46.0)
HEMOGLOBIN: 10.3 g/dL — AB (ref 12.0–15.0)
MCH: 20.4 pg — AB (ref 26.0–34.0)
MCHC: 31.9 g/dL (ref 30.0–36.0)
MCV: 64 fL — AB (ref 78.0–100.0)
PLATELETS: 394 10*3/uL (ref 150–400)
RBC: 5.05 MIL/uL (ref 3.87–5.11)
RDW: 19.1 % — AB (ref 11.5–15.5)
WBC: 8.3 10*3/uL (ref 4.0–10.5)

## 2015-01-22 LAB — BASIC METABOLIC PANEL
ANION GAP: 8 (ref 5–15)
BUN: 11 mg/dL (ref 6–20)
CALCIUM: 9 mg/dL (ref 8.9–10.3)
CO2: 25 mmol/L (ref 22–32)
Chloride: 102 mmol/L (ref 101–111)
Creatinine, Ser: 0.82 mg/dL (ref 0.44–1.00)
Glucose, Bld: 102 mg/dL — ABNORMAL HIGH (ref 65–99)
Potassium: 3.5 mmol/L (ref 3.5–5.1)
Sodium: 135 mmol/L (ref 135–145)

## 2015-01-22 LAB — TROPONIN I: Troponin I: <0.03 ng/mL (ref <0.031)

## 2015-01-22 LAB — D-DIMER, QUANTITATIVE: D-Dimer, Quant: 1.17 ug/mL-FEU — ABNORMAL HIGH (ref 0.00–0.48)

## 2015-01-22 MED ORDER — IOHEXOL 350 MG/ML SOLN
100.0000 mL | Freq: Once | INTRAVENOUS | Status: AC | PRN
  Administered 2015-01-22: 100 mL via INTRAVENOUS

## 2015-01-22 NOTE — Discharge Instructions (Signed)

## 2015-01-22 NOTE — ED Notes (Signed)
Patient reports extensive travel over the past week in a car.  Reports feeling fluttering in chest which began Monday.

## 2015-01-22 NOTE — ED Provider Notes (Signed)
CSN: [id number]     Arrival date & time 01/22/15  1921 History   First MD Initiated Contact with Patient 01/22/15 1923     Chief Complaint  Patient presents with  . Shortness of Breath     (Consider location/radiation/quality/duration/timing/severity/associated sxs/prior Treatment) HPI Comments: 44 year old female complaining of shortness of breath 4 days, gradually worsening. Reports shortness of breath both at rest and on exertion. States an uncomfortable feeling in her chest described as a fluttering feeling. Denies chest pain. No alleviating factors. Reports having shortness of breath in the past, never this severe. Over the past few weeks, patient has been on multiple long car rides from [place] to [place], down to [place], back to [place] and again back to [place]. Denies calf pain or swelling. Denies fever, chills, cough, wheezing, nausea, vomiting, diaphoresis. No history of blood clots. Nonsmoker.  Patient is a 44 y.o. female presenting with shortness of breath. The history is provided by the patient.  Shortness of Breath   Past Medical History  Diagnosis Date  . GERD with apnea    Past Surgical History  Procedure Laterality Date  . Cholecystectomy    . Tubal ligation     No family history on file. History  Substance Use Topics  . Smoking status: Never Smoker   . Smokeless tobacco: Not on file  . Alcohol Use: Yes   OB History    No data available     Review of Systems  Respiratory: Positive for shortness of breath.   Cardiovascular: Positive for palpitations.  All other systems reviewed and are negative.     Allergies  Other and Strawberry  Home Medications   Prior to Admission medications   Medication Sig Start Date End Date Taking? Authorizing Provider  esomeprazole (NEXIUM) 40 MG capsule Take 1 capsule (40 mg total) by mouth daily. 03/04/12 01/22/15 Yes Cyndra Numbers, MD  alum & mag hydroxide-simeth (MAALOX/MYLANTA) 200-200-20 MG/5ML suspension Take  15 mLs by mouth every 6 (six) hours as needed. FOR INDIGESTION    Historical Provider, MD  amoxicillin (AMOXIL) 500 MG capsule Take 1 capsule (500 mg total) by mouth 3 (three) times daily. 04/04/14   Gilda Crease, MD  aspirin-acetaminophen-caffeine (EXCEDRIN MIGRAINE) [phone] MG per tablet Take 1 tablet by mouth every 6 (six) hours as needed. FOR HEADACHE    Historical Provider, MD  azithromycin (ZITHROMAX Z-PAK) 250 MG tablet 2 po day one, then 1 daily x 4 days 01/15/13   Rolan Bucco, MD  esomeprazole (NEXIUM) 40 MG capsule Take 40 mg by mouth daily.    Historical Provider, MD  ferrous sulfate 325 (65 FE) MG tablet Take 1 tablet (325 mg total) by mouth daily. 03/04/12 03/04/13  Cyndra Numbers, MD  mometasone (NASONEX) 50 MCG/ACT nasal spray Place 2 sprays into the nose daily. 04/04/14   Gilda Crease, MD   BP 142/82 mmHg  Pulse 81  Temp(Src) 98.1 F (36.7 C) (Oral)  Resp 18  SpO2 100%  LMP 01/16/2015 Physical Exam  Constitutional: She is oriented to person, place, and time. She appears well-developed and well-nourished. No distress.  HENT:  Head: Normocephalic and atraumatic.  Mouth/Throat: Oropharynx is clear and moist.  Eyes: Conjunctivae and EOM are normal. Pupils are equal, round, and reactive to light.  Neck: Normal range of motion. Neck supple. No JVD present.  Cardiovascular: Normal rate, regular rhythm, normal heart sounds and intact distal pulses.   No extremity edema.  Pulmonary/Chest: Effort normal and breath sounds normal. No  respiratory distress.  Abdominal: Soft. Bowel sounds are normal. There is no tenderness.  Musculoskeletal: Normal range of motion. She exhibits no edema.  Neurological: She is alert and oriented to person, place, and time. She has normal strength. No sensory deficit.  Speech fluent, goal oriented. Moves extremities without ataxia. Equal grip strength bilateral.  Skin: Skin is warm and dry. She is not diaphoretic.  Psychiatric: She has a  normal mood and affect. Her behavior is normal.  Nursing note and vitals reviewed.   ED Course  Procedures (including critical care time) Labs Review Labs Reviewed  CBC - Abnormal; Notable for the following:    Hemoglobin 10.3 (*)    HCT 32.3 (*)    MCV 64.0 (*)    MCH 20.4 (*)    RDW 19.1 (*)    All other components within normal limits  BASIC METABOLIC PANEL - Abnormal; Notable for the following:    Glucose, Bld 102 (*)    All other components within normal limits  D-DIMER, QUANTITATIVE (NOT AT [facility]) - Abnormal; Notable for the following:    D-Dimer, Quant 1.17 (*)    All other components within normal limits  TROPONIN I    Imaging Review Dg Chest 2 View  01/22/2015   CLINICAL DATA:  Shortness of breath for 4 days  EXAM: CHEST  2 VIEW  COMPARISON:  Chest radiograph and chest CT January 15, 2013  FINDINGS: There is minimal left base atelectasis. Lungs elsewhere clear. Heart size and pulmonary vascularity are normal. No adenopathy. No bone lesions.  IMPRESSION: Minimal left base atelectasis.  Lungs otherwise clear.   Electronically Signed   By: Bretta Bang III M.D.   On: 01/22/2015 20:28   Ct Angio Chest Pe W/cm &/or Wo Cm  01/22/2015   CLINICAL DATA:  Shortness of breath.  EXAM: CT ANGIOGRAPHY CHEST WITH CONTRAST  TECHNIQUE: Multidetector CT imaging of the chest was performed using the standard protocol during bolus administration of intravenous contrast. Multiplanar CT image reconstructions and MIPs were obtained to evaluate the vascular anatomy.  CONTRAST:  OMNIPAQUE IOHEXOL 350 MG/ML SOLN  COMPARISON:  01/15/2013  FINDINGS: Mediastinum: Mild cardiac enlargement. The trachea appears patent and is midline. Normal appearance of the esophagus. No mediastinal or hilar adenopathy identified. The main pulmonary artery appears normal. No saddle embolus. No lobar or segmental pulmonary artery filling defects identified.  Lungs/Pleura: Atelectasis is identified within both lung bases.  No airspace consolidation identified. No interstitial edema. No pleural effusion.  Upper Abdomen: The visualized portions of the upper abdomen are unremarkable.  Musculoskeletal: Mild spondylosis identified within the thoracic spine. No aggressive lytic or sclerotic bone lesions.  Review of the MIP images confirms the above findings.  IMPRESSION: 1. Examination is negative for acute pulmonary embolus. 2. Bibasilar atelectasis.   Electronically Signed   By: Signa Kell M.D.   On: 01/22/2015 21:29     EKG Interpretation   Date/Time:  Wednesday January 22 2015 19:55:10 EDT Ventricular Rate:  88 PR Interval:  164 QRS Duration: 76 QT Interval:  378 QTC Calculation: 457 R Axis:   -6 Text Interpretation:  Normal sinus rhythm Abnormal ECG Confirmed by Lincoln Brigham [id number]) on 01/22/2015 8:06:05 PM      MDM   Final diagnoses:  Shortness of breath   Non-toxic appearing, NAD. AFVSS. No respiratory distress. Initial suspicion for PE given recent long travel and increased SOB. D-dimer elevated, CT angio obtained without acute finding. No PE. Doubt cardiac. HEART score  1. Low suspicion. When discussing results with pt, reports a hx of GERD causing difficulty breathing in the past, does not believe this is the same. Denies any increased stress or anxiety. Normal respirations, O2 sat 100% on RA. No increased SOB with ambulation. Stable for d/c. F/u with PCP. Return precautions given. Patient states understanding of treatment care plan and is agreeable.  Kathrynn Speed, PA-C 01/22/15 2144  Tilden Fossa, MD 01/22/15 [phone]

## 2015-01-22 NOTE — ED Notes (Signed)
Pt c/o SOB x4 days with a fluttering feeling in her chest. Pt has been on long car trip last week.

## 2018-10-04 ENCOUNTER — Encounter (HOSPITAL_BASED_OUTPATIENT_CLINIC_OR_DEPARTMENT_OTHER): Payer: Self-pay

## 2018-10-04 ENCOUNTER — Emergency Department (HOSPITAL_BASED_OUTPATIENT_CLINIC_OR_DEPARTMENT_OTHER): Payer: 59

## 2018-10-04 ENCOUNTER — Other Ambulatory Visit: Payer: Self-pay

## 2018-10-04 ENCOUNTER — Emergency Department (HOSPITAL_BASED_OUTPATIENT_CLINIC_OR_DEPARTMENT_OTHER)
Admission: EM | Admit: 2018-10-04 | Discharge: 2018-10-04 | Disposition: A | Discharge status: Home / Self Care | Payer: 59 | Attending: Emergency Medicine | Admitting: Emergency Medicine

## 2018-10-04 DIAGNOSIS — K219 Gastro-esophageal reflux disease without esophagitis: Secondary | ICD-10-CM | POA: Insufficient documentation

## 2018-10-04 DIAGNOSIS — R072 Precordial pain: Secondary | ICD-10-CM | POA: Insufficient documentation

## 2018-10-04 DIAGNOSIS — Z79899 Other long term (current) drug therapy: Secondary | ICD-10-CM | POA: Insufficient documentation

## 2018-10-04 HISTORY — DX: Gastro-esophageal reflux disease without esophagitis: K21.9

## 2018-10-04 LAB — TROPONIN I: Troponin I: <0.03 ng/mL (ref <0.03)

## 2018-10-04 LAB — COMPREHENSIVE METABOLIC PANEL
ALT: 18 U/L (ref 0–44)
AST: 19 U/L (ref 15–41)
Albumin: 4.4 g/dL (ref 3.5–5.0)
Alkaline Phosphatase: 44 U/L (ref 38–126)
Anion gap: 6 (ref 5–15)
BUN: 15 mg/dL (ref 6–20)
CO2: 26 mmol/L (ref 22–32)
Calcium: 9.7 mg/dL (ref 8.9–10.3)
Chloride: 103 mmol/L (ref 98–111)
Creatinine, Ser: 0.89 mg/dL (ref 0.44–1.00)
GFR calc Af Amer: >60 mL/min (ref >60)
GFR calc non Af Amer: >60 mL/min (ref >60)
Glucose, Bld: 102 mg/dL — ABNORMAL HIGH (ref 70–99)
Potassium: 3.7 mmol/L (ref 3.5–5.1)
Sodium: 135 mmol/L (ref 135–145)
Total Bilirubin: 0.3 mg/dL (ref 0.3–1.2)
Total Protein: 7.9 g/dL (ref 6.5–8.1)

## 2018-10-04 LAB — CBC WITH DIFFERENTIAL/PLATELET
Abs Immature Granulocytes: 0.01 10*3/uL (ref 0.00–0.07)
Basophils Absolute: 0 10*3/uL (ref 0.0–0.1)
Basophils Relative: 0 %
Eosinophils Absolute: 0.4 10*3/uL (ref 0.0–0.5)
Eosinophils Relative: 5 %
HCT: 40.4 % (ref 36.0–46.0)
Hemoglobin: 12.1 g/dL (ref 12.0–15.0)
Immature Granulocytes: 0 %
Lymphocytes Relative: 25 %
Lymphs Abs: 1.9 10*3/uL (ref 0.7–4.0)
MCH: 21.3 pg — ABNORMAL LOW (ref 26.0–34.0)
MCHC: 30 g/dL (ref 30.0–36.0)
MCV: 71.3 fL — ABNORMAL LOW (ref 80.0–100.0)
Monocytes Absolute: 0.4 10*3/uL (ref 0.1–1.0)
Monocytes Relative: 5 %
Neutro Abs: 4.8 10*3/uL (ref 1.7–7.7)
Neutrophils Relative %: 65 %
Platelets: 262 10*3/uL (ref 150–400)
RBC: 5.67 MIL/uL — ABNORMAL HIGH (ref 3.87–5.11)
RDW: 15.6 % — ABNORMAL HIGH (ref 11.5–15.5)
WBC: 7.5 10*3/uL (ref 4.0–10.5)
nRBC: 0 % (ref 0.0–0.2)

## 2018-10-04 LAB — LIPASE, BLOOD: Lipase: 25 U/L (ref 11–51)

## 2018-10-04 MED ORDER — ALUM & MAG HYDROXIDE-SIMETH 200-200-20 MG/5ML PO SUSP
30.0000 mL | Freq: Once | ORAL | Status: AC
  Administered 2018-10-04: 30 mL via ORAL
  Filled 2018-10-04: qty 30

## 2018-10-04 MED ORDER — FAMOTIDINE 20 MG PO TABS: 20.0000 mg | ORAL_TABLET | Freq: Two times a day (BID) | ORAL | 0 refills | Status: AC

## 2018-10-04 NOTE — ED Provider Notes (Signed)
[facility] EMERGENCY DEPARTMENT Provider Note   CSN: [id number] Arrival date & time: 10/04/18  1348    History   Chief Complaint Chief Complaint  Patient presents with  . Gastroesophageal Reflux    HPI Suzanne Armstrong is a 48 y.o. female.     HPI Patient reports for the past week or so she has thought her reflux was acting up.  She used to be on Nexium daily but almost a year ago, the symptoms seemed pretty well controlled.  She then went to as needed Nexium and typically takes it 1 to several times per week.  She reports last week she started noticing some burning and feeling of need to belch.  This is been in the central upper chest.  She particularly noticed that it was worse at night after eating.  He denies any associated weakness, shortness of breath, lightheadedness or diaphoresis.  He reports he has started to make sure it was about 3 hours before going to bed after eating and took Nexium last night and this morning.  She reports that she went to urgent care to get checked and they advised she come to the emergency department.  Patient reports she is currently taking some Tums today and that also has helped the symptoms.  Patient denies tobacco use.  He denies early onset of coronary artery disease in family members.  Patient reports that her husband thought it was due to stress.  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  Patient reports she has been walking to keep herself exercising and has not been getting any problems with chest pain on exertion. Past Medical History:  Diagnosis Date  . GERD (gastroesophageal reflux disease)     There are no active problems to display for this patient.   Past Surgical History:  Procedure Laterality Date  . CHOLECYSTECTOMY    . TUBAL LIGATION       OB History   No obstetric history on file.      Home Medications    Prior to Admission medications    Medication Sig Start Date End Date Taking? Authorizing Provider  alum & mag hydroxide-simeth (MAALOX/MYLANTA) 200-200-20 MG/5ML suspension Take 15 mLs by mouth every 6 (six) hours as needed. FOR INDIGESTION    [provider]  amoxicillin (AMOXIL) 500 MG capsule Take 1 capsule (500 mg total) by mouth 3 (three) times daily. 04/04/14   Gilda Crease, MD  aspirin-acetaminophen-caffeine (EXCEDRIN MIGRAINE) [phone] MG per tablet Take 1 tablet by mouth every 6 (six) hours as needed. FOR HEADACHE    [provider]  azithromycin (ZITHROMAX Z-PAK) 250 MG tablet 2 po day one, then 1 daily x 4 days 01/15/13   Rolan Bucco, MD  esomeprazole (NEXIUM) 40 MG capsule Take 40 mg by mouth daily.    [provider]  esomeprazole (NEXIUM) 40 MG capsule Take 1 capsule (40 mg total) by mouth daily. 03/04/12 01/22/15  Cyndra Numbers, MD  famotidine (PEPCID) 20 MG tablet Take 1 tablet (20 mg total) by mouth 2 (two) times daily. 10/04/18   Arby Barrette, MD  ferrous sulfate 325 (65 FE) MG tablet Take 1 tablet (325 mg total) by mouth daily. 03/04/12 03/04/13  Cyndra Numbers, MD  mometasone (NASONEX) 50 MCG/ACT nasal spray Place 2 sprays into the nose daily. 04/04/14   Gilda Crease, MD    Family History No family history on  file.  Social History Social History   Tobacco Use  . Smoking status: Never Smoker  . Smokeless tobacco: Never Used  Substance Use Topics  . Alcohol use: Yes    Comment: occ  . Drug use: No     Allergies   Other and Strawberry extract   Review of Systems Review of Systems 10 Systems reviewed and are negative for acute change except as noted in the HPI.   Physical Exam Updated Vital Signs BP (!) 160/89 (BP Location: Left Arm)   Pulse 84   Temp 98.2 F (36.8 C) (Oral)   Resp 17   Ht 5' (1.524 m)   Wt 90.7 kg   LMP 09/07/2018   SpO2 100%   BMI 39.06 kg/m   Physical Exam Constitutional:      Appearance: She is well-developed.  HENT:      Head: Normocephalic and atraumatic.  Eyes:     Extraocular Movements: Extraocular movements intact.     Conjunctiva/sclera: Conjunctivae normal.  Neck:     Musculoskeletal: Neck supple.  Cardiovascular:     Rate and Rhythm: Normal rate and regular rhythm.     Heart sounds: Normal heart sounds.  Pulmonary:     Effort: Pulmonary effort is normal.     Breath sounds: Normal breath sounds.  Chest:     Chest wall: No tenderness.  Abdominal:     General: Bowel sounds are normal. There is no distension.     Palpations: Abdomen is soft.     Tenderness: There is no abdominal tenderness.  Musculoskeletal: Normal range of motion.        General: No swelling or tenderness.     Right lower leg: No edema.     Left lower leg: No edema.  Skin:    General: Skin is warm and dry.  Neurological:     Mental Status: She is alert and oriented to person, place, and time.     GCS: GCS eye subscore is 4. GCS verbal subscore is 5. GCS motor subscore is 6.     Coordination: Coordination normal.  Psychiatric:        Mood and Affect: Mood normal.      ED Treatments / Results  Labs (all labs ordered are listed, but only abnormal results are displayed) Labs Reviewed  COMPREHENSIVE METABOLIC PANEL - Abnormal; Notable for the following components:      Result Value   Glucose, Bld 102 (*)    All other components within normal limits  CBC WITH DIFFERENTIAL/PLATELET - Abnormal; Notable for the following components:   RBC 5.67 (*)    MCV 71.3 (*)    MCH 21.3 (*)    RDW 15.6 (*)    All other components within normal limits  LIPASE, BLOOD  TROPONIN I    EKG EKG Interpretation  Date/Time:  Wednesday October 04 2018 14:09:15 EDT Ventricular Rate:  100 PR Interval:    QRS Duration: 86 QT Interval:  359 QTC Calculation: 463 R Axis:   -24 Text Interpretation:  Sinus tachycardia Borderline left axis deviation RSR' in V1 or V2, probably normal variant no sig change from prervious Confirmed by Arby Barrette [phone]) on 10/04/2018 2:14:07 PM   Radiology Dg Chest 2 View  Result Date: 10/04/2018 CLINICAL DATA:  Chest pain EXAM: CHEST - 2 VIEW COMPARISON:  01/22/2015 FINDINGS: Heart and mediastinal contours are within normal limits. No focal opacities or effusions. No acute bony abnormality. IMPRESSION: No active cardiopulmonary disease. Electronically Signed  By: Charlett NoseKevin  Dover M.D.   On: 10/04/2018 14:41    Procedures Procedures (including critical care time)  Medications Ordered in ED Medications  alum & mag hydroxide-simeth (MAALOX/MYLANTA) 200-200-20 MG/5ML suspension 30 mL (30 mLs Oral Given 10/04/18 1433)     Initial Impression / Assessment and Plan / ED Course  I have reviewed the triage vital signs and the nursing notes.  Pertinent labs & imaging results that were available during my care of the patient were reviewed by me and considered in my medical decision making (see chart for details).       Symptoms sound very consistent with reflux.  Patient does not appear to have risk factors for early onset coronary artery disease.  Troponin negative and EKG without ischemic changes.  Patient is counseled on signs and symptoms for which to return.  She is counseled on follow-up with PCP for any further diagnostic testing needed for completing cardiac work-up.  Time, advice is to start twice daily Pepcid as well as continuing daily Nexium with precautions for managing GERD at home.  Discharged in good condition.  Final Clinical Impressions(s) / ED Diagnoses   Final diagnoses:  Gastroesophageal reflux disease without esophagitis  Precordial pain    ED Discharge Orders         Ordered    famotidine (PEPCID) 20 MG tablet  2 times daily     10/04/18 1636           Arby BarrettePfeiffer, Sharlet Notaro, MD 10/04/18 1658

## 2018-10-04 NOTE — ED Notes (Signed)
ED Provider at bedside. 

## 2018-10-04 NOTE — ED Notes (Signed)
Patient transported to X-ray 

## 2018-10-04 NOTE — ED Notes (Signed)
Pt reports no longer taking Nexium regularly and eating worse since she has been home. Pt reports feeling like she needs to burp. Denies actual chest pain but reports chest burning like heart burn.

## 2018-10-04 NOTE — ED Triage Notes (Signed)
C/o GERD and feeling like she needs to burp but not burping-NAD-steady gait

## 2018-10-04 NOTE — Discharge Instructions (Signed)
1.  At this time your discomfort appears most likely due to reflux.  Continue your Nexium daily and add Pepcid twice a day for the next week. 2.  Follow-up with your doctor for recheck.  Return to the emergency department immediately if you have other concerning symptoms such as shortness of breath, lightheadedness, feeling close to passing out, chest pain .

## 2020-07-12 ENCOUNTER — Emergency Department (HOSPITAL_BASED_OUTPATIENT_CLINIC_OR_DEPARTMENT_OTHER)
Admission: EM | Admit: 2020-07-12 | Discharge: 2020-07-12 | Disposition: A | Discharge status: Home / Self Care | Payer: PRIVATE HEALTH INSURANCE | Attending: Emergency Medicine | Admitting: Emergency Medicine

## 2020-07-12 ENCOUNTER — Encounter (HOSPITAL_BASED_OUTPATIENT_CLINIC_OR_DEPARTMENT_OTHER): Payer: Self-pay | Admitting: Emergency Medicine

## 2020-07-12 ENCOUNTER — Other Ambulatory Visit: Payer: Self-pay

## 2020-07-12 DIAGNOSIS — Z7982 Long term (current) use of aspirin: Secondary | ICD-10-CM | POA: Insufficient documentation

## 2020-07-12 DIAGNOSIS — I10 Essential (primary) hypertension: Secondary | ICD-10-CM | POA: Diagnosis not present

## 2020-07-12 DIAGNOSIS — U071 COVID-19: Secondary | ICD-10-CM | POA: Diagnosis not present

## 2020-07-12 DIAGNOSIS — R0981 Nasal congestion: Secondary | ICD-10-CM | POA: Diagnosis present

## 2020-07-12 HISTORY — DX: Essential (primary) hypertension: I10

## 2020-07-12 LAB — SARS CORONAVIRUS 2 (TAT 6-24 HRS): SARS Coronavirus 2: POSITIVE — AB

## 2020-07-12 NOTE — ED Triage Notes (Signed)
Nasal congestion since yesterday. + covid exposure.

## 2020-07-12 NOTE — ED Provider Notes (Signed)
[facility] EMERGENCY DEPARTMENT Provider Note   CSN: [id number] Arrival date & time: 07/12/20  2585    History Chief Complaint  Patient presents with  . Nasal Congestion    Suzanne Armstrong is a 50 y.o. female with past medical history significant for hypertension who presents for evaluation of congestion and rhinorrhea.  Began 2 days ago.  Was exposed to someone who is COVID of positive.  Has had some sinus pressure and clear rhinorrhea.  She is not vaccinated against COVID.  No headache, lightheadedness, dizziness, fever, chills, nausea, vomiting, sore throat, chest pain, shortness breath abdominal pain, diarrhea, dysuria.  She has been taking Mucinex and Sudafed x1 which her PCP recommended for her to take.  States has helped her symptoms however she does not like taking medications.  She is concerned she may have COVID.  He does not want a COVID test here while she is here in the emergency department.  She is questioning whether antibiotics would help her runny nose.  Denies additional aggravating or alleviating factors.  History obtained from patient and past medical records.  No interpreter is used   HPI     Past Medical History:  Diagnosis Date  . GERD (gastroesophageal reflux disease)   . Hypertension     There are no problems to display for this patient.   Past Surgical History:  Procedure Laterality Date  . CHOLECYSTECTOMY    . TUBAL LIGATION       OB History   No obstetric history on file.     No family history on file.  Social History   Tobacco Use  . Smoking status: Never Smoker  . Smokeless tobacco: Never Used  Vaping Use  . Vaping Use: Never used  Substance Use Topics  . Alcohol use: Yes    Comment: occ  . Drug use: No    Home Medications Prior to Admission medications   Medication Sig Start Date End Date Taking? Authorizing Provider  alum & mag hydroxide-simeth (MAALOX/MYLANTA) 200-200-20 MG/5ML suspension Take 15 mLs by mouth every  6 (six) hours as needed. FOR INDIGESTION    [provider]  amoxicillin (AMOXIL) 500 MG capsule Take 1 capsule (500 mg total) by mouth 3 (three) times daily. 04/04/14   Gilda Crease, MD  aspirin-acetaminophen-caffeine (EXCEDRIN MIGRAINE) [phone] MG per tablet Take 1 tablet by mouth every 6 (six) hours as needed. FOR HEADACHE    [provider]  azithromycin (ZITHROMAX Z-PAK) 250 MG tablet 2 po day one, then 1 daily x 4 days 01/15/13   Rolan Bucco, MD  esomeprazole (NEXIUM) 40 MG capsule Take 40 mg by mouth daily.    [provider]  esomeprazole (NEXIUM) 40 MG capsule Take 1 capsule (40 mg total) by mouth daily. 03/04/12 01/22/15  Cyndra Numbers, MD  famotidine (PEPCID) 20 MG tablet Take 1 tablet (20 mg total) by mouth 2 (two) times daily. 10/04/18   Arby Barrette, MD  ferrous sulfate 325 (65 FE) MG tablet Take 1 tablet (325 mg total) by mouth daily. 03/04/12 03/04/13  Cyndra Numbers, MD  mometasone (NASONEX) 50 MCG/ACT nasal spray Place 2 sprays into the nose daily. 04/04/14   Gilda Crease, MD    Allergies    Other and Strawberry extract  Review of Systems   Review of Systems  Constitutional: Negative.   HENT: Positive for congestion, postnasal drip and rhinorrhea.   Respiratory: Negative.   Cardiovascular: Negative.   Gastrointestinal: Negative.   Genitourinary: Negative.  Musculoskeletal: Negative.   Skin: Negative.   Neurological: Negative.   All other systems reviewed and are negative.   Physical Exam Updated Vital Signs BP (!) 163/99 (BP Location: Left Arm)   Pulse 95   Temp 97.7 F (36.5 C) (Oral)   Resp 18   Ht 5' (1.524 m)   Wt 95.3 kg   LMP 06/22/2020   SpO2 99%   BMI 41.01 kg/m   Physical Exam Vitals and nursing note reviewed.  Constitutional:      General: She is not in acute distress.    Appearance: She is well-developed and well-nourished. She is not ill-appearing, toxic-appearing or diaphoretic.  HENT:     Head:  Normocephalic and atraumatic.     Jaw: There is normal jaw occlusion.     Nose: Congestion and rhinorrhea present. Rhinorrhea is clear.     Right Turbinates: Enlarged and swollen.     Left Turbinates: Enlarged and swollen.     Right Sinus: No maxillary sinus tenderness or frontal sinus tenderness.     Left Sinus: No maxillary sinus tenderness or frontal sinus tenderness.     Comments: Boggy nasal turbinates with clear discharge    Mouth/Throat:     Lips: Pink.     Mouth: Mucous membranes are moist.     Pharynx: Oropharynx is clear. Uvula midline.     Comments: Posterior oropharynx clear.  Mucous membranes moist.  Tongue midline Eyes:     Pupils: Pupils are equal, round, and reactive to light.  Cardiovascular:     Rate and Rhythm: Normal rate.     Pulses: Normal pulses and intact distal pulses.     Heart sounds: Normal heart sounds.  Pulmonary:     Effort: Pulmonary effort is normal. No respiratory distress.     Breath sounds: Normal breath sounds and air entry.     Comments: Clear to auscultation bilateral wheeze, rhonchi or rales.  Speaks in full sentences without difficulty. Abdominal:     General: There is no distension.  Musculoskeletal:        General: Normal range of motion.     Cervical back: Normal range of motion.     Comments: Moves all 4 extremities without difficulty.  Skin:    General: Skin is warm and dry.  Neurological:     Mental Status: She is alert.     Comments: Cranial nerves II to XII grossly intact Ambulatory with out difficulty  Psychiatric:        Mood and Affect: Mood and affect normal.     ED Results / Procedures / Treatments   Labs (all labs ordered are listed, but only abnormal results are displayed) Labs Reviewed  SARS CORONAVIRUS 2 (TAT 6-24 HRS)    EKG None  Radiology No results found.  Procedures Procedures (including critical care time)  Medications Ordered in ED Medications - No data to display  ED Course  I have reviewed  the triage vital signs and the nursing notes.  Pertinent labs & imaging results that were available during my care of the patient were reviewed by me and considered in my medical decision making (see chart for details).  Patient presents for evaluation of ingestion of rhinorrhea in setting of known COVID exposure.  She is not vaccinated.  Discussed with PCP yesterday was started on OTC medication.  She has taken this x1 however does not like taking medications.  On arrival she is afebrile, nonseptic, not ill-appearing.  Her heart and lungs are  clear.  She appears overall well.  She has moist mucous membranes, tongue midline.  Posterior oropharynx clear.  She does have boggy nasal turbinates bilaterally with clear rhinorrhea.  No chest pain, shortness of breath.  Nonfocal neuro exam without deficits.  Originally did not want COVID test however upon discharge she is now requesting a COVID test.  We will do a COVID test.  She will follow-up with results on MyChart.  Encourage OTC medication provided by PCP.  She will return for any worsening symptoms  The patient has been appropriately medically screened and/or stabilized in the ED. I have low suspicion for any other emergent medical condition which would require further screening, evaluation or treatment in the ED or require inpatient management.  Patient is hemodynamically stable and in no acute distress.  Patient able to ambulate in department prior to ED.  Evaluation does not show acute pathology that would require ongoing or additional emergent interventions while in the emergency department or further inpatient treatment.  I have discussed the diagnosis with the patient and answered all questions.  Pain is been managed while in the emergency department and patient has no further complaints prior to discharge.  Patient is comfortable with plan discussed in room and is stable for discharge at this time.  I have discussed strict return precautions for  returning to the emergency department.  Patient was encouraged to follow-up with PCP/specialist refer to at discharge.    MDM Rules/Calculators/A&P                          Suzanne Armstrong was evaluated in Emergency Department on 07/12/2020 for the symptoms described in the history of present illness. She was evaluated in the context of the global COVID-19 pandemic, which necessitated consideration that the patient might be at risk for infection with the SARS-CoV-2 virus that causes COVID-19. Institutional protocols and algorithms that pertain to the evaluation of patients at risk for COVID-19 are in a state of rapid change based on information released by regulatory bodies including the CDC and federal and state organizations. These policies and algorithms were followed during the patient's care in the ED. Final Clinical Impression(s) / ED Diagnoses Final diagnoses:  Nasal congestion    Rx / DC Orders ED Discharge Orders    None       Dyllin Gulley A, PA-C 07/12/20 1038    Derwood Kaplan, MD 07/13/20 1830

## 2020-07-12 NOTE — Discharge Instructions (Signed)
You did not want COVID test while you are here in the emergency department.  As discussed I would assume you are positive given your known exposure.  I would continue taking the Mucinex and Sudafed discussed by your PCP.  If you have any new or worsening symptoms I would suggest reevaluation in the emergency department.

## 2020-07-14 ENCOUNTER — Telehealth (HOSPITAL_COMMUNITY): Payer: Self-pay | Admitting: Family

## 2020-07-14 NOTE — Telephone Encounter (Signed)
Called to discuss with Suzanne Armstrong about Covid symptoms and the use of casirivimab/imdevimab, a combination monoclonal antibody infusion for those with mild to moderate Covid symptoms and at a high risk of hospitalization.     Pt is qualified for this infusion or antiviral therapy, however declines infusion at this time as she states she is feeling pretty well besides a stuffy nose. Symptoms tier reviewed as well as criteria for ending isolation.  Symptoms reviewed that would warrant ED/Hospital evaluation. Preventative practices reviewed. Patient verbalized understanding. Patient advised to call back if she decides that she does want this treatment. Callback number to the infusion center given. Patient advised to go to Urgent care or ED with severe symptoms.    There are no problems to display for this patient.   Trena Dunavan,NP

## 2020-08-03 ENCOUNTER — Other Ambulatory Visit: Payer: Self-pay

## 2020-08-03 ENCOUNTER — Encounter (HOSPITAL_BASED_OUTPATIENT_CLINIC_OR_DEPARTMENT_OTHER): Payer: Self-pay | Admitting: Emergency Medicine

## 2020-08-03 ENCOUNTER — Emergency Department (HOSPITAL_BASED_OUTPATIENT_CLINIC_OR_DEPARTMENT_OTHER)
Admission: EM | Admit: 2020-08-03 | Discharge: 2020-08-03 | Disposition: A | Discharge status: Home / Self Care | Payer: PRIVATE HEALTH INSURANCE | Attending: Emergency Medicine | Admitting: Emergency Medicine

## 2020-08-03 ENCOUNTER — Emergency Department (HOSPITAL_BASED_OUTPATIENT_CLINIC_OR_DEPARTMENT_OTHER): Payer: PRIVATE HEALTH INSURANCE

## 2020-08-03 DIAGNOSIS — I10 Essential (primary) hypertension: Secondary | ICD-10-CM | POA: Diagnosis not present

## 2020-08-03 DIAGNOSIS — R0789 Other chest pain: Secondary | ICD-10-CM | POA: Diagnosis present

## 2020-08-03 LAB — TROPONIN I (HIGH SENSITIVITY)
Troponin I (High Sensitivity): <2 ng/L
Troponin I (High Sensitivity): <2 ng/L (ref <18)

## 2020-08-03 LAB — HEPATIC FUNCTION PANEL
ALT: 16 U/L (ref 0–44)
AST: 15 U/L (ref 15–41)
Albumin: 3.9 g/dL (ref 3.5–5.0)
Alkaline Phosphatase: 39 U/L (ref 38–126)
Bilirubin, Direct: 0.1 mg/dL (ref 0.0–0.2)
Indirect Bilirubin: 0.4 mg/dL (ref 0.3–0.9)
Total Bilirubin: 0.5 mg/dL (ref 0.3–1.2)
Total Protein: 7.4 g/dL (ref 6.5–8.1)

## 2020-08-03 LAB — BASIC METABOLIC PANEL
Anion gap: 8 (ref 5–15)
BUN: 10 mg/dL (ref 6–20)
CO2: 25 mmol/L (ref 22–32)
Calcium: 8.9 mg/dL (ref 8.9–10.3)
Chloride: 103 mmol/L (ref 98–111)
Creatinine, Ser: 0.8 mg/dL (ref 0.44–1.00)
GFR, Estimated: >60 mL/min (ref >60)
Glucose, Bld: 87 mg/dL (ref 70–99)
Potassium: 4.2 mmol/L (ref 3.5–5.1)
Sodium: 136 mmol/L (ref 135–145)

## 2020-08-03 LAB — CBC
HCT: 36.7 % (ref 36.0–46.0)
Hemoglobin: 11.1 g/dL — ABNORMAL LOW (ref 12.0–15.0)
MCH: 21.3 pg — ABNORMAL LOW (ref 26.0–34.0)
MCHC: 30.2 g/dL (ref 30.0–36.0)
MCV: 70.3 fL — ABNORMAL LOW (ref 80.0–100.0)
Platelets: 283 10*3/uL (ref 150–400)
RBC: 5.22 MIL/uL — ABNORMAL HIGH (ref 3.87–5.11)
RDW: 17.7 % — ABNORMAL HIGH (ref 11.5–15.5)
WBC: 8.2 10*3/uL (ref 4.0–10.5)
nRBC: 0 % (ref 0.0–0.2)

## 2020-08-03 LAB — PREGNANCY, URINE: Preg Test, Ur: NEGATIVE

## 2020-08-03 LAB — LIPASE, BLOOD: Lipase: 31 U/L (ref 11–51)

## 2020-08-03 MED ORDER — ASPIRIN 81 MG PO CHEW: 81.0000 mg | CHEWABLE_TABLET | Freq: Every day | ORAL | 0 refills | Status: AC

## 2020-08-03 MED ORDER — PANTOPRAZOLE SODIUM 20 MG PO TBEC: 20.0000 mg | DELAYED_RELEASE_TABLET | Freq: Every day | ORAL | 0 refills | Status: DC

## 2020-08-03 MED ORDER — LIDOCAINE VISCOUS HCL 2 % MT SOLN
15.0000 mL | Freq: Once | OROMUCOSAL | Status: AC
  Administered 2020-08-03: 15 mL via ORAL
  Filled 2020-08-03: qty 15

## 2020-08-03 MED ORDER — SUCRALFATE 1 GM/10ML PO SUSP: 1.0000 g | Freq: Three times a day (TID) | ORAL | 0 refills | Status: DC

## 2020-08-03 MED ORDER — FAMOTIDINE IN NACL 20-0.9 MG/50ML-% IV SOLN
20.0000 mg | Freq: Once | INTRAVENOUS | Status: AC
  Administered 2020-08-03: 20 mg via INTRAVENOUS
  Filled 2020-08-03: qty 50

## 2020-08-03 MED ORDER — PANTOPRAZOLE SODIUM 40 MG IV SOLR
40.0000 mg | Freq: Once | INTRAVENOUS | Status: AC
  Administered 2020-08-03: 40 mg via INTRAVENOUS
  Filled 2020-08-03: qty 40

## 2020-08-03 MED ORDER — ALUM & MAG HYDROXIDE-SIMETH 200-200-20 MG/5ML PO SUSP
30.0000 mL | Freq: Once | ORAL | Status: AC
  Administered 2020-08-03: 30 mL via ORAL
  Filled 2020-08-03: qty 30

## 2020-08-03 NOTE — Discharge Instructions (Addendum)
At this time there does not appear to be the presence of an emergent medical condition, however there is always the potential for conditions to change. Please read and follow the below instructions.  Please return to the Emergency Department immediately for any new or worsening symptoms. Please be sure to follow up with your Primary Care Provider within one week regarding your visit today; please call their office to schedule an appointment even if you are feeling better for a follow-up visit. Begin taking 81 mg aspirin daily as prescribed Stop taking omeprazole and instead begin taking pantoprazole to help with your acid reflux.  Continue taking your Pepcid as prescribed to help with your acid reflux.  You may begin taking Carafate as prescribed with your symptoms. Please follow-up with a specialist at [facility] for further evaluation and establishment of a cardiologist  Go to the nearest Emergency Department immediately if: You have fever or chills You have a cough that gets worse, or you cough up blood. You have very bad (severe) pain in your belly (abdomen). You pass out (faint). You have either of these for no clear reason: Sudden chest discomfort. Sudden discomfort in your arms, back, neck, or jaw. You have shortness of breath at any time. You suddenly start to sweat, or your skin gets clammy. You feel sick to your stomach (nauseous). You throw up (vomit). You suddenly feel lightheaded or dizzy. You feel very weak or tired. Your heart starts to beat fast, or it feels like it is skipping beats. You have any new/concerning or worsening of symptoms   Please read the additional information packets attached to your discharge summary.  Do not take your medicine if  develop an itchy rash, swelling in your mouth or lips, or difficulty breathing; call 911 and seek immediate emergency medical attention if this occurs.  You may review your lab tests and imaging results in their  entirety on your MyChart account.  Please discuss all results of fully with your primary care provider and other specialist at your follow-up visit.  Note: Portions of this text may have been transcribed using voice recognition software. Every effort was made to ensure accuracy; however, inadvertent computerized transcription errors may still be present.

## 2020-08-03 NOTE — ED Triage Notes (Signed)
Pt endorses mid sternal CP radiating to left shoulder since Thursday. Pt denies N/V, denies cough, endorses hx acid reflux

## 2020-08-03 NOTE — ED Provider Notes (Signed)
[facility] EMERGENCY DEPARTMENT Provider Note   CSN: [id number] Arrival date & time: 08/03/20  1644     History Chief Complaint  Patient presents with  . Chest Pain    Suzanne Armstrong is a 50 y.o. female history of GERD, hypertension, hyperlipidemia, obesity, cholecystectomy, tubal ligation.  Patient arrives today for central chest burning sensation radiating to her left shoulder onset 2 days ago pain has been constant moderate intensity no clear aggravating or alleviating factors.  She attributed her pain to her GERD but reports compliance with her omeprazole.  When pain lasted more than 1 day she thought she should get it evaluated here at the ER.  Denies fever/chills, cough/hemoptysis, nausea/vomiting, diaphoresis, extremity swelling/color change, history of blood clot, exogenous hormone use, recent surgery/immobilization, history of cancer, family history of heart disease, personal history of heart disease, history of peripheral artery disease, smoking or any additional concerns.  HPI     Past Medical History:  Diagnosis Date  . GERD (gastroesophageal reflux disease)   . Hypertension     There are no problems to display for this patient.   Past Surgical History:  Procedure Laterality Date  . CHOLECYSTECTOMY    . TUBAL LIGATION       OB History   No obstetric history on file.     History reviewed. No pertinent family history.  Social History   Tobacco Use  . Smoking status: Never Smoker  . Smokeless tobacco: Never Used  Vaping Use  . Vaping Use: Never used  Substance Use Topics  . Alcohol use: Yes    Comment: occ  . Drug use: No    Home Medications Prior to Admission medications   Medication Sig Start Date End Date Taking? Authorizing Provider  aspirin 81 MG chewable tablet Chew 1 tablet (81 mg total) by mouth daily. 08/03/20  Yes Harlene Salts A, PA-C  pantoprazole (PROTONIX) 20 MG tablet Take 1 tablet (20 mg total) by mouth daily. 08/03/20   Yes Harlene Salts A, PA-C  sucralfate (CARAFATE) 1 GM/10ML suspension Take 10 mLs (1 g total) by mouth 4 (four) times daily -  with meals and at bedtime. 08/03/20  Yes Harlene Salts A, PA-C  alum & mag hydroxide-simeth (MAALOX/MYLANTA) 200-200-20 MG/5ML suspension Take 15 mLs by mouth every 6 (six) hours as needed. FOR INDIGESTION    [provider]  amoxicillin (AMOXIL) 500 MG capsule Take 1 capsule (500 mg total) by mouth 3 (three) times daily. 04/04/14   Gilda Crease, MD  aspirin-acetaminophen-caffeine (EXCEDRIN MIGRAINE) [phone] MG per tablet Take 1 tablet by mouth every 6 (six) hours as needed. FOR HEADACHE    [provider]  azithromycin (ZITHROMAX Z-PAK) 250 MG tablet 2 po day one, then 1 daily x 4 days 01/15/13   Rolan Bucco, MD  famotidine (PEPCID) 20 MG tablet Take 1 tablet (20 mg total) by mouth 2 (two) times daily. 10/04/18   Arby Barrette, MD  ferrous sulfate 325 (65 FE) MG tablet Take 1 tablet (325 mg total) by mouth daily. 03/04/12 03/04/13  Cyndra Numbers, MD  mometasone (NASONEX) 50 MCG/ACT nasal spray Place 2 sprays into the nose daily. 04/04/14   Gilda Crease, MD  esomeprazole (NEXIUM) 40 MG capsule Take 1 capsule (40 mg total) by mouth daily. 03/04/12 08/03/20  Cyndra Numbers, MD    Allergies    Other and Strawberry extract  Review of Systems   Review of Systems Ten systems are reviewed and are negative for acute change  except as noted in the HPI  Physical Exam Updated Vital Signs BP 135/69 (BP Location: Left Arm)   Pulse 69   Temp 98 F (36.7 C) (Oral)   Resp 14   Ht 5' (1.524 m)   Wt 91.6 kg   LMP 06/16/2020 (LMP Unknown)   SpO2 100%   BMI 39.45 kg/m   Physical Exam Constitutional:      General: She is not in acute distress.    Appearance: Normal appearance. She is well-developed. She is not ill-appearing or diaphoretic.  HENT:     Head: Normocephalic and atraumatic.  Eyes:     General: Vision grossly intact. Gaze aligned  appropriately.     Pupils: Pupils are equal, round, and reactive to light.  Neck:     Trachea: Trachea and phonation normal.  Cardiovascular:     Rate and Rhythm: Normal rate and regular rhythm.     Pulses:          Radial pulses are 2+ on the right side and 2+ on the left side.       Dorsalis pedis pulses are 2+ on the right side and 2+ on the left side.     Heart sounds: Normal heart sounds.  Pulmonary:     Effort: Pulmonary effort is normal. No respiratory distress.     Breath sounds: Normal breath sounds.  Chest:     Chest wall: No deformity, tenderness or crepitus.  Abdominal:     General: There is no distension.     Palpations: Abdomen is soft.     Tenderness: There is no abdominal tenderness. There is no guarding or rebound. Negative signs include Murphy's sign.  Musculoskeletal:        General: Normal range of motion.     Cervical back: Normal range of motion.     Right lower leg: No tenderness. No edema.     Left lower leg: No tenderness. No edema.  Skin:    General: Skin is warm and dry.  Neurological:     Mental Status: She is alert.     GCS: GCS eye subscore is 4. GCS verbal subscore is 5. GCS motor subscore is 6.     Comments: Speech is clear and goal oriented, follows commands Major Cranial nerves without deficit, no facial droop Moves extremities without ataxia, coordination intact  Psychiatric:        Behavior: Behavior normal.     ED Results / Procedures / Treatments   Labs (all labs ordered are listed, but only abnormal results are displayed) Labs Reviewed  CBC - Abnormal; Notable for the following components:      Result Value   RBC 5.22 (*)    Hemoglobin 11.1 (*)    MCV 70.3 (*)    MCH 21.3 (*)    RDW 17.7 (*)    All other components within normal limits  BASIC METABOLIC PANEL  PREGNANCY, URINE  LIPASE, BLOOD  HEPATIC FUNCTION PANEL  TROPONIN I (HIGH SENSITIVITY)  TROPONIN I (HIGH SENSITIVITY)    EKG EKG  Interpretation  Date/Time:  Sunday August 03 2020 16:49:06 EST Ventricular Rate:  79 PR Interval:  164 QRS Duration: 76 QT Interval:  394 QTC Calculation: 451 R Axis:   -5 Text Interpretation: Normal sinus rhythm Normal ECG When compared to prior, similar apperance. No STEMI Confirmed by Theda Belfast ([id number]) on 08/03/2020 5:17:59 PM   Radiology DG Chest Portable 1 View  Result Date: 08/03/2020 CLINICAL DATA:  Chest pain EXAM:  PORTABLE CHEST 1 VIEW COMPARISON:  Chest radiograph October 04, 2018 FINDINGS: The heart size and mediastinal contours are within normal limits. Both lungs are clear. The visualized skeletal structures are unremarkable. IMPRESSION: No acute cardiopulmonary process. Electronically Signed   By: Maudry Mayhew MD   On: 08/03/2020 17:35    Procedures Procedures   Medications Ordered in ED Medications  famotidine (PEPCID) IVPB 20 mg premix (20 mg Intravenous New Bag/Given 08/03/20 2019)  pantoprazole (PROTONIX) injection 40 mg (40 mg Intravenous Given 08/03/20 1819)  alum & mag hydroxide-simeth (MAALOX/MYLANTA) 200-200-20 MG/5ML suspension 30 mL (30 mLs Oral Given 08/03/20 1819)    And  lidocaine (XYLOCAINE) 2 % viscous mouth solution 15 mL (15 mLs Oral Given 08/03/20 1819)    ED Course  I have reviewed the triage vital signs and the nursing notes.  Pertinent labs & imaging results that were available during my care of the patient were reviewed by me and considered in my medical decision making (see chart for details).    MDM Rules/Calculators/A&P                         Additional history obtained from: 1. Nursing notes from this visit. 2. EMR review. ----------------- 50 year old female history as above presents with 2-day history of chest pain feels similar to previous GERD.  She does not have any exertional symptoms, or recent infectious symptoms or any additional concerns.  Will obtain chest pain work-up.  Patient is low risk by Wells criteria and PERC negative,  low suspicion for pulmonary embolism at this time. - I ordered, reviewed and interpreted labs which include: CBC shows no leukocytosis to suggest infection, mild anemia of 11.1 similar to prior. BMP shows no emergent electrolyte derangement, AKI or gap. Lipase within normal limits, doubt pancreatitis. LFTs within normal limits, doubt biliary obstruction. Urine pregnancy test negative. High-sensitivity troponin negative x2.  Doubt ACS.  CXR:  IMPRESSION:  No acute cardiopulmonary process.   EKG: Normal sinus rhythm Normal ECG When compared to prior, similar apperance. No STEMI Confirmed by Theda Belfast ([id number]) on 08/03/2020 5:17:59 PM --------------------------------- Patient reassessed reports temporary relief of symptoms following GI cocktail and Protonix reports improvement of symptoms but feel they have started to return, will give additional Pepcid.  Patient's vital signs remained stable without tachycardia hypotension or hypoxia.  Suspect patient symptoms today may be secondary to reflux disease versus possible musculoskeletal chest pain.  Work-up above is reassuring.  Low suspicion for ACS, pulmonary embolism, dissection, bacterial pneumonia, symptomatic anemia or other emergent cardiopulmonary etiologies at this time.  Patient does have risk factors including obesity hypertension hyperlipidemia, based on heart pathway on epic heart score is 4.  I discussed this with patient and shared decision-making was made, I offered potential of observation admission with the patient versus outpatient cardiology follow-up.  Patient elects to be discharged and wishes to follow-up as outpatient and she feels her symptoms improved after GI medicine.  Will start patient on baby aspirin and place ambulatory referral to cardiology.  Additionally as patient's symptoms are atypical and feel is most likely secondary to reflux will have patient stop omeprazole and instead start Protonix in addition to Pepcid and  Carafate.  Patient's case and plan of care was discussed with Dr. Rush Landmark who is in agreement.    At this time there does not appear to be any evidence of an acute emergency medical condition and the patient appears stable for discharge with appropriate outpatient  follow up. Diagnosis was discussed with patient who verbalizes understanding of care plan and is agreeable to discharge. I have discussed return precautions with patient who verbalizes understanding. Patient encouraged to follow-up with their PCP and Cardiology. All questions answered.  Note: Portions of this report may have been transcribed using voice recognition software. Every effort was made to ensure accuracy; however, inadvertent computerized transcription errors may still be present. Final Clinical Impression(s) / ED Diagnoses Final diagnoses:  Atypical chest pain    Rx / DC Orders ED Discharge Orders         Ordered    aspirin 81 MG chewable tablet  Daily        08/03/20 2014    sucralfate (CARAFATE) 1 GM/10ML suspension  3 times daily with meals & bedtime        08/03/20 2014    pantoprazole (PROTONIX) 20 MG tablet  Daily        08/03/20 2014    Ambulatory referral to Cardiology        08/03/20 2014           Elizabeth Palau 08/03/20 2025    Tegeler, Canary Brim, MD 08/03/20 2136

## 2020-08-26 ENCOUNTER — Ambulatory Visit (INDEPENDENT_AMBULATORY_CARE_PROVIDER_SITE_OTHER): Payer: No Typology Code available for payment source | Admitting: Cardiology

## 2020-08-26 ENCOUNTER — Encounter: Payer: Self-pay | Admitting: Cardiology

## 2020-08-26 ENCOUNTER — Other Ambulatory Visit: Payer: Self-pay

## 2020-08-26 VITALS — BP 118/82 | HR 77 | Ht 60.0 in | Wt 217.2 lb

## 2020-08-26 DIAGNOSIS — R072 Precordial pain: Secondary | ICD-10-CM

## 2020-08-26 DIAGNOSIS — E7849 Other hyperlipidemia: Secondary | ICD-10-CM

## 2020-08-26 DIAGNOSIS — I10 Essential (primary) hypertension: Secondary | ICD-10-CM | POA: Diagnosis not present

## 2020-08-26 NOTE — Patient Instructions (Signed)
Medication Instructions:  No changes   *If you need a refill on your cardiac medications before your next appointment, please call your pharmacy*   Lab Work: You will need a COVID-19  Test 3 days prior to your procedure.  This is a Drive Up Visit at [address] [place], [place] [address]. Someone will direct you to the appropriate testing line. Stay in your car and someone will be with you shortly    Testing/Procedures:  will be schedule at [address] You will need a COVID-19  Test 3 days prior to your procedure.  This is a Drive Up Visit at [address] [place], [place] [address]. Someone will direct you to the appropriate testing line. Stay in your car and someone will be with you shortly. Your physician has requested that you have an exercise tolerance test.  Please also follow instruction sheet, as given.     Follow-Up: At [facility], you and your health needs are our priority.  As part of our continuing mission to provide you with exceptional heart care, we have created designated Provider Care Teams.  These Care Teams include your primary Cardiologist (physician) and Advanced Practice Providers (APPs -  Physician Assistants and Nurse Practitioners) who all work together to provide you with the care you need, when you need it.     Your next appointment:   1 month(s)  The format for your next appointment:   Virtual Visit   Provider:   Bryan Lemma, MD   Other Instructions

## 2020-08-26 NOTE — Progress Notes (Signed)
Primary Care Provider: Laqueta Due., MD Cardiologist: Bryan Lemma, MD Electrophysiologist: None  Clinic Note: Chief Complaint  Patient presents with  . Hospitalization Follow-up    ER follow-up chest pain.  . Chest Pain    Has not really had any since ER discharge. Is taking combination of Carafate and Pepcid along with Protonix   ===================================  ASSESSMENT/PLAN   Problem List Items Addressed This Visit    Precordial chest pain - Primary    She did have precordial chest pain.  Probably sounds like GERD, however she is somewhat anxious, and would like to try to get in to some type of exercise routine.  She is scared to get into anything until she is sure that her heart is okay.  Her father had heart disease, she knew it was heart failure, but did not actually have heart attack.  Plan: Baseline evaluation with [facility].  Shared Decision Making/Informed Consent{ All outpatient stress tests require an informed consent ([id number]) ATTESTATION ORDER  The risks [chest pain, shortness of breath, cardiac arrhythmias, dizziness, blood pressure fluctuations, myocardial infarction, stroke/transient ischemic attack, and life-threatening complications (estimated to be 1 in 10,000)], benefits (risk stratification, diagnosing coronary artery disease, treatment guidance) and alternatives of an exercise tolerance test were discussed in detail with Suzanne Armstrong and she agrees to proceed.        Relevant Orders   EKG 12-Lead (Completed)   EXERCISE TOLERANCE TEST (ETT)   Cardiac Stress Test: Informed Consent Details: Physician/Practitioner Attestation; Transcribe to consent form and obtain patient signature   Essential hypertension (Chronic)    Blood pressure pretty well controlled today.  She takes carvedilol 25 mg twice daily.      Relevant Medications   carvedilol (COREG) 25 MG tablet   pravastatin (PRAVACHOL) 20 MG tablet   EPINEPHrine 0.3 mg/0.3 mL IJ SOAJ injection    Other Relevant Orders   Cardiac Stress Test: Informed Consent Details: Physician/Practitioner Attestation; Transcribe to consent form and obtain patient signature   Hyperlipidemia due to dietary fat intake (Chronic)    She is taking pravastatin 20 mg daily.  Labs from June showed LDL 111, and total cholesterol 214.  This is better was September 20.  May want to consider being a little more aggressive with hypertension, hypercholesterolemia and obesity meeting criteria for metabolic syndrome.      Relevant Medications   carvedilol (COREG) 25 MG tablet   pravastatin (PRAVACHOL) 20 MG tablet   EPINEPHrine 0.3 mg/0.3 mL IJ SOAJ injection   Other Relevant Orders   Cardiac Stress Test: Informed Consent Details: Physician/Practitioner Attestation; Transcribe to consent form and obtain patient signature     ===================================  HPI:    Suzanne Armstrong is a 50 y.o. female with a PMH notable for Hypertension, Hyperlipidemia, Obesity, GERD, and UC (stable on mesalamine) who presents today for ER/hospital follow-up for evaluation of CHEST PAIN.  Suzanne Armstrong was referred by the [facility] EDP (Dr. Cristal Deer Tegeler).  Recent Hospitalizations:   [facility] ER 08/03/2020: Central chest burning sensation radiating to left shoulder.  Began 2 days PTA.  No alleviating or aggravating factors.  Thought that it may be GERD, but has been compliant with omeprazole.  Went to ER after the last more than 1 day.  EKG was normal, labs were normal. => She did have temporary relief with GI cocktail and Protonix.  Heart score of 4 (obesity, hypertension hyperlipidemia.=> Recommend starting aspirin, and recommended using Protonix along with Pepcid  and Carafate.  Prior to her ER visit, she was diagnosed with Covid back in January.  She had a pretty high fever and lots of fatigue in the first day, then felt better until Thursday when she started having persistent cough  that lasted about 6 days.  No loss of taste or smell.  She has been somewhat tired since.  Reviewed  CV studies:    The following studies were reviewed today: (if available, images/films reviewed: From Epic Chart or Care Everywhere) . None:   Interval History:   Suzanne Armstrong presents here today for follow-up of her ER visit.  She has not had any further symptoms of chest discomfort since the ER visit.  She said that she started treatment for her GERD seems to have improved quite a bit.  The combination of Carafate and Pepcid have helped.  She not noticing any exertional chest discomfort.  She does get little short of breath with exertion, but acknowledges that she is somewhat deconditioned.  She is wanting to try to lose some weight, but is a little bit fearful of exercising, especially with his recent chest pain evaluation.  However, she is really asymptomatic otherwise from cardiac standpoint.  CV Review of Symptoms (Summary): positive for - The chest discomfort leading to ER visit, but none since.  Some exertional dyspnea. negative for - edema, irregular heartbeat, orthopnea, palpitations, paroxysmal nocturnal dyspnea, rapid heart rate, shortness of breath or Lightheadedness, dizziness or syncope/near syncope, TIA/amaurosis fugax or claudication.  The patient does not have symptoms concerning for COVID-19 infection (fever, chills, cough, or new shortness of breath).   REVIEWED OF SYSTEMS   Review of Systems  Constitutional: Negative for malaise/fatigue and weight loss (She had lost 40 pounds, but gained 25 back).  HENT: Negative for congestion and nosebleeds.   Respiratory: Positive for cough (With allergies). Negative for shortness of breath.   Gastrointestinal: Negative for abdominal pain (No UC flares recently), blood in stool and melena.  Genitourinary: Negative for hematuria.  Musculoskeletal: Positive for joint pain. Negative for falls.  Neurological: Negative for dizziness,  weakness and headaches.  Psychiatric/Behavioral: Negative for depression and memory loss. The patient is not nervous/anxious and does not have insomnia.    I have reviewed and (if needed) personally updated the patient's problem list, medications, allergies, past medical and surgical history, social and family history.   PAST MEDICAL HISTORY   Past Medical History:  Diagnosis Date  . GERD (gastroesophageal reflux disease)   . Hypertension   . Mixed dyslipidemia    Elevated triglycerides, and total cholesterol/LDL.  . Morbid obesity with BMI of 40.0-44.9, adult (HCC)   . Ulcerative colitis, chronic (HCC)    On mesalamine    PAST SURGICAL HISTORY   Past Surgical History:  Procedure Laterality Date  . LAPAROSCOPIC CHOLECYSTECTOMY  2015  . TUBAL LIGATION  1996    There is no immunization history on file for this patient.  MEDICATIONS/ALLERGIES   Current Meds  Medication Sig  . aspirin 81 MG chewable tablet Chew 1 tablet (81 mg total) by mouth daily.  . carvedilol (COREG) 25 MG tablet Take 25 mg by mouth 2 (two) times daily.  Marland Kitchen EPINEPHrine 0.3 mg/0.3 mL IJ SOAJ injection epinephrine 0.3 mg/0.3 mL injection, auto-injector  . famotidine (PEPCID) 20 MG tablet Take 1 tablet (20 mg total) by mouth 2 (two) times daily.  . mesalamine (LIALDA) 1.2 g EC tablet Take by mouth.  . Multiple Vitamins-Iron (MULTI-VITAMIN/IRON PO) Take 1 capsule by mouth  2 (two) times daily.  . pantoprazole (PROTONIX) 20 MG tablet Take 1 tablet (20 mg total) by mouth daily.  . pravastatin (PRAVACHOL) 20 MG tablet Take 20 mg by mouth at bedtime.  . sucralfate (CARAFATE) 1 GM/10ML suspension Take 10 mLs (1 g total) by mouth 4 (four) times daily -  with meals and at bedtime.    Allergies  Allergen Reactions  . Other     NUTS - ITCHING   . Strawberry Extract Itching    SOCIAL HISTORY/FAMILY HISTORY   Reviewed in Epic:  Pertinent findings:  Social History   Tobacco Use  . Smoking status: Never Smoker   . Smokeless tobacco: Never Used  Vaping Use  . Vaping Use: Never used  Substance Use Topics  . Alcohol use: Yes    Comment: occ  . Drug use: No   Social History   Social History Narrative   Married mother of 2 daughters with 2 grandchildren.   Currently lives with her husband.  [place] recently moved from [place].      She walks around her apartment during the day while working in order to stay active, but tries to do at least 20 minutes of walking 2 days a week.  Fortunately, during the COVID-19 lockdown, she gained quite a bit of weight.-She had lost 40 pounds and then gained 25 back.   Family History  Problem Relation Age of Onset  . Hypertension Mother   . Heart failure Father   . Other Brother        She has 2 older and 3 younger brothers, some heartburn  . Other Paternal Grandmother        Unknown, seems stable    OBJCTIVE -PE, EKG, labs   Wt Readings from Last 3 Encounters:  08/26/20 217 lb 3.2 oz (98.5 kg)  08/03/20 202 lb (91.6 kg)  07/12/20 210 lb (95.3 kg)    Physical Exam: BP 118/82   Pulse 77   Ht 5' (1.524 m)   Wt 217 lb 3.2 oz (98.5 kg)   LMP 06/16/2020 (LMP Unknown)   SpO2 98%   BMI 42.42 kg/m  Physical Exam   Adult ECG Report  Rate: 77;  Rhythm: normal sinus rhythm and Borderline low voltage.  RSR prime.  Otherwise normal.;   Narrative Interpretation: Relatively normal EKG.  Recent Labs: Reviewed  Lab Results  Component Value Date   CREATININE 0.80 08/03/2020   BUN 10 08/03/2020   NA 136 08/03/2020   K 4.2 08/03/2020   CL 103 08/03/2020   CO2 25 08/03/2020   CBC Latest Ref Rng & Units 08/03/2020 10/04/2018 01/22/2015  WBC 4.0 - 10.5 K/uL 8.2 7.5 8.3  Hemoglobin 12.0 - 15.0 g/dL 11.1(L) 12.1 10.3(L)  Hematocrit 36.0 - 46.0 % 36.7 40.4 32.3(L)  Platelets 150 - 400 K/uL 283 262 394   [facility] Related to Lipid Profile Component 12/26/19 03/28/19 10/06/18 01/06/18  LDL Direct 111 128 174High 155High  Total  Cholesterol 214 223High 282High 227High  Triglycerides 156 105 108 105  HDL Cholesterol 67 70 73 65  Total Chol / HDL Cholesterol 3.2 3.2 3.9 3.5  Non-HDL Cholesterol 147  153  119209  162    ==================================================  COVID-19 Education: The signs and symptoms of COVID-19 were discussed with the patient and how to seek care for testing (follow up with PCP or arrange E-visit).   The importance of social distancing and COVID-19 vaccination was discussed today. The patient is practicing  social distancing & Masking.   I spent a total of with the patient spent in direct patient consultation.  Additional time spent with chart review  / charting (studies, outside notes, etc): 16 min Total Time: 47 min   Current medicines are reviewed at length with the patient today.  (+/- concerns) n/a  This visit occurred during the SARS-CoV-2 public health emergency.  Safety protocols were in place, including screening questions prior to the visit, additional usage of staff PPE, and extensive cleaning of exam room while observing appropriate contact time as indicated for disinfecting solutions.  Notice: This dictation was prepared with Dragon dictation along with smaller phrase technology. Any transcriptional errors that result from this process are unintentional and may not be corrected upon review.  Patient Instructions / Medication Changes & Studies & Tests Ordered   Patient Instructions  Medication Instructions:  No changes   *If you need a refill on your cardiac medications before your next appointment, please call your pharmacy*   Lab Work: You will need a COVID-19  Test 3 days prior to your procedure.  This is a Drive Up Visit at [address] [place], [place] [address]. Someone will direct you to the appropriate testing line. Stay in your car and someone will be with you shortly    Testing/Procedures:  will be schedule at [address] You will need a COVID-19  Test 3 days prior to your procedure.  This is a Drive Up Visit at [address] [place], [place] [address]. Someone will direct you to the appropriate testing line. Stay in your car and someone will be with you shortly. Your physician has requested that you have an exercise tolerance test.  Please also follow instruction sheet, as given.     Follow-Up: At [facility], you and your health needs are our priority.  As part of our continuing mission to provide you with exceptional heart care, we have created designated Provider Care Teams.  These Care Teams include your primary Cardiologist (physician) and Advanced Practice Providers (APPs -  Physician Assistants and Nurse Practitioners) who all work together to provide you with the care you need, when you need it.     Your next appointment:   1 month(s)  The format for your next appointment:   Virtual Visit   Provider:   Bryan Lemma, MD   Other Instructions    Studies Ordered:   Orders Placed This Encounter  Procedures  . Cardiac Stress Test: Informed Consent Details: Physician/Practitioner Attestation; Transcribe to consent form and obtain patient signature  . EXERCISE TOLERANCE TEST (ETT)  . EKG 12-Lead     Bryan Lemma, M.D., M.S. Interventional Cardiologist   Pager # [phone] Phone # [phone] [address] [place], [place] [address]   Thank you for choosing Heartcare at [facility]!!

## 2020-08-31 ENCOUNTER — Encounter: Payer: Self-pay | Admitting: Cardiology

## 2020-08-31 DIAGNOSIS — E7849 Other hyperlipidemia: Secondary | ICD-10-CM | POA: Insufficient documentation

## 2020-08-31 DIAGNOSIS — R072 Precordial pain: Secondary | ICD-10-CM | POA: Insufficient documentation

## 2020-08-31 DIAGNOSIS — I10 Essential (primary) hypertension: Secondary | ICD-10-CM | POA: Insufficient documentation

## 2020-08-31 NOTE — Assessment & Plan Note (Signed)
She did have precordial chest pain.  Probably sounds like GERD, however she is somewhat anxious, and would like to try to get in to some type of exercise routine.  She is scared to get into anything until she is sure that her heart is okay.  Her father had heart disease, she knew it was heart failure, but did not actually have heart attack.  Plan: Baseline evaluation with [facility].  Shared Decision Making/Informed Consent{ All outpatient stress tests require an informed consent ([id number]) ATTESTATION ORDER  The risks [chest pain, shortness of breath, cardiac arrhythmias, dizziness, blood pressure fluctuations, myocardial infarction, stroke/transient ischemic attack, and life-threatening complications (estimated to be 1 in 10,000)], benefits (risk stratification, diagnosing coronary artery disease, treatment guidance) and alternatives of an exercise tolerance test were discussed in detail with Suzanne Armstrong and she agrees to proceed.

## 2020-08-31 NOTE — Assessment & Plan Note (Signed)
Blood pressure pretty well controlled today.  She takes carvedilol 25 mg twice daily.

## 2020-08-31 NOTE — Assessment & Plan Note (Signed)
She is taking pravastatin 20 mg daily.  Labs from June showed LDL 111, and total cholesterol 214.  This is better was September 20.  May want to consider being a little more aggressive with hypertension, hypercholesterolemia and obesity meeting criteria for metabolic syndrome.

## 2020-09-16 ENCOUNTER — Other Ambulatory Visit (HOSPITAL_COMMUNITY): Payer: No Typology Code available for payment source

## 2020-09-19 ENCOUNTER — Other Ambulatory Visit: Payer: Self-pay

## 2020-09-19 ENCOUNTER — Encounter: Payer: Self-pay | Admitting: Cardiology

## 2020-09-19 ENCOUNTER — Ambulatory Visit (HOSPITAL_COMMUNITY)
Admission: RE | Admit: 2020-09-19 | Payer: PRIVATE HEALTH INSURANCE | Source: Ambulatory Visit | Attending: Cardiology | Admitting: Cardiology

## 2020-09-24 ENCOUNTER — Telehealth (HOSPITAL_COMMUNITY): Payer: Self-pay | Admitting: *Deleted

## 2020-09-24 NOTE — Telephone Encounter (Signed)
Close encounter 

## 2020-09-25 ENCOUNTER — Other Ambulatory Visit: Payer: Self-pay

## 2020-09-25 ENCOUNTER — Ambulatory Visit (HOSPITAL_COMMUNITY)
Admission: RE | Admit: 2020-09-25 | Discharge: 2020-09-25 | Disposition: A | Discharge status: Home / Self Care | Payer: PRIVATE HEALTH INSURANCE | Source: Ambulatory Visit | Attending: Cardiology | Admitting: Cardiology

## 2020-09-25 DIAGNOSIS — R072 Precordial pain: Secondary | ICD-10-CM | POA: Diagnosis not present

## 2020-09-25 HISTORY — PX: OTHER SURGICAL HISTORY: SHX169

## 2020-09-25 LAB — EXERCISE TOLERANCE TEST
Estimated workload: 10.1 METS
Exercise duration (min): 8 min
Exercise duration (sec): 0 s
MPHR: 171 {beats}/min
Peak HR: 157 {beats}/min
Percent HR: 91 %
Rest HR: 80 {beats}/min

## 2020-10-03 ENCOUNTER — Telehealth (INDEPENDENT_AMBULATORY_CARE_PROVIDER_SITE_OTHER): Payer: PRIVATE HEALTH INSURANCE | Admitting: Cardiology

## 2020-10-03 ENCOUNTER — Encounter: Payer: Self-pay | Admitting: Cardiology

## 2020-10-03 DIAGNOSIS — I10 Essential (primary) hypertension: Secondary | ICD-10-CM | POA: Diagnosis not present

## 2020-10-03 DIAGNOSIS — R072 Precordial pain: Secondary | ICD-10-CM

## 2020-10-03 DIAGNOSIS — E7849 Other hyperlipidemia: Secondary | ICD-10-CM | POA: Diagnosis not present

## 2020-10-03 NOTE — Progress Notes (Signed)
Virtual Visit via Telephone Note   This visit type was conducted due to national recommendations for restrictions regarding the COVID-19 Pandemic (e.g. social distancing) in an effort to limit this patient's exposure and mitigate transmission in our community.  Due to her co-morbid illnesses, this patient is at least at moderate risk for complications without adequate follow up.  This format is felt to be most appropriate for this patient at this time.  The patient did not have access to video technology/had technical difficulties with video requiring transitioning to audio format only (telephone).  All issues noted in this document were discussed and addressed.  No physical exam could be performed with this format.  Please refer to the patient's chart for her  consent to telehealth for [name].   Patient has given verbal permission to conduct this visit via virtual appointment and to bill insurance 10/03/2020 4:18 PM     Evaluation Performed:  Follow-up visit  Date:  10/03/2020   ID:  Suzanne Armstrong, DOB 1971-04-24, MRN [id number]  Patient Location: Home Provider Location: Home Office  PCP:  Laqueta Due., MD  Cardiologist:  Bryan Lemma, MD  Electrophysiologist:  None   Chief Complaint:   Chief Complaint  Patient presents with  . pt offers no complaints today    ====================================  ASSESSMENT & PLAN:    Problem List Items Addressed This Visit    Precordial chest pain    Based on how significant stress test, and the fact her symptoms are being relieved with change PPI, suspect that this is probably GERD related pain.  The stress test results are reassuring, and she will discuss possible screening Coronary Calcium Score testing with PCP.      Essential hypertension (Chronic)    Blood pressure well controlled today. Continue carvedilol.      Hyperlipidemia due to dietary fat intake (Chronic)    Her target LDL should probably be less than 100.  Was  pretty close to last check.  She is now on pravastatin.  Follow-up PCP. With obesity and hypertension all HLD, she does need criteria for metabolic syndrome.  We discussed screening evaluation for baseline CAD.  She just had a relatively normal GXT which would exclude significant CAD, but she does have risk factors.  Best option to screen for underlying CAD would be a coronary calcium score.  At this subjectivity ordered by PCP.  If there are abnormal findings, I will be happy to see her back to discuss results.  She is nearing 50 years old, at which point we do start recommending surveillance evaluation.  We discussed Coronary Calcium Score is a out-of-pocket low-level CT scan performed at a moderate number of radiology departments in the [facility].  She will discuss this with her PCP when she is seen back to recheck labs etc.         ====================================  History of Present Illness:    Suzanne Armstrong is a 50 y.o. female with PMH notable for HTN, HLD, Obesity, UC & GERD who presents via audio/video conferencing for a telehealth visit today as a 1 month f/u Evaluation of CHEST PAIN. Marland Kitchen  Suzanne Armstrong was last seen 08/26/2020:   Hospitalizations:  . NONE   Recent - Interim CV studies:   The following studies were reviewed today: . ETT 09/25/2020: Exercised 8:00 min, Peak HR 157 bpm (91% MPHR of 171). 10.1 METS - reached target HR 85% MPHR @ 5:50 min.. No ischemic symptoms or EKG changes.  No arrhythmias.   Inerval History   Suzanne Armstrong was just seen by GI MD - started on PPI & Sx are starting to feel better.  Walking ~20 min 7d/week since last visit. No CP & only gets a bit of SOB making it up the hill - but now, not having to stop.   Cardiovascular ROS: positive for - She used to get short of breath going up a hill, but much less so than just last month. negative for - chest pain, edema, irregular heartbeat, orthopnea, palpitations, paroxysmal  nocturnal dyspnea, rapid heart rate, shortness of breath or Lightheadedness, dizziness and wooziness, syncope/near syncope or TIA/amaurosis fugax, claudication   ROS:  Please see the history of present illness.    The patient does not have symptoms concerning for COVID-19 infection (fever, chills, cough, or new shortness of breath).  Review of Systems  Constitutional: Negative for malaise/fatigue and weight loss (She appears to have gained weight).  Respiratory: Negative for shortness of breath.   Gastrointestinal: Negative for abdominal pain and constipation.  Musculoskeletal: Negative for joint pain.  Neurological: Negative.   Psychiatric/Behavioral: Negative.     Past Medical History:  Diagnosis Date  . GERD (gastroesophageal reflux disease)   . Hypertension   . Mixed dyslipidemia    Elevated triglycerides, and total cholesterol/LDL.  . Morbid obesity with BMI of 40.0-44.9, adult (HCC)   . Ulcerative colitis, chronic (HCC)    On mesalamine   Past Surgical History:  Procedure Laterality Date  . GRADUATEDEXERCISE TREADMILLSTRESS TEST (GXT/ETT)  09/25/2020   Exercised 8:00 min, Peak HR 157 bpm (91% MPHR of 171). 10.1 METS - reached target HR 85% MPHR @ 5:50 min.. No ischemic symptoms or EKG changes. No arrhythmias.   Marland Kitchen LAPAROSCOPIC CHOLECYSTECTOMY  2015  . TUBAL LIGATION  1996     Current Meds  Medication Sig  . aspirin 81 MG chewable tablet Chew 1 tablet (81 mg total) by mouth daily.  . carvedilol (COREG) 25 MG tablet Take 25 mg by mouth 2 (two) times daily.  Marland Kitchen EPINEPHrine 0.3 mg/0.3 mL IJ SOAJ injection epinephrine 0.3 mg/0.3 mL injection, auto-injector  . esomeprazole (NEXIUM) 20 MG capsule Take 20 mg by mouth 2 (two) times daily before a meal.  . famotidine (PEPCID) 20 MG tablet Take 1 tablet (20 mg total) by mouth 2 (two) times daily.  . mesalamine (LIALDA) 1.2 g EC tablet Take 1.2 g by mouth in the morning and at bedtime.  . Multiple Vitamins-Iron (MULTI-VITAMIN/IRON PO)  Take 1 capsule by mouth 2 (two) times daily.  . pravastatin (PRAVACHOL) 20 MG tablet Take 20 mg by mouth at bedtime.     Allergies:   Other and Strawberry extract   Social History   Tobacco Use  . Smoking status: Never Smoker  . Smokeless tobacco: Never Used  Vaping Use  . Vaping Use: Never used  Substance Use Topics  . Alcohol use: Yes    Comment: occ  . Drug use: No     Family Hx: The patient's family history includes Heart failure in her father; Hypertension in her mother; Other in her brother and paternal grandmother.   Labs/Other Tests and Data Reviewed:    EKG:  No ECG reviewed.  Recent Labs: 08/03/2020: ALT 16; BUN 10; Creatinine, Ser 0.80; Hemoglobin 11.1; Platelets 283; Potassium 4.2; Sodium 136   Recent Lipid Panel No results found for: CHOL, TRIG, HDL, CHOLHDL, LDLCALC, LDLDIRECT  Wt Readings from Last 3 Encounters:  10/03/20 218 lb (98.9 kg)  08/26/20 217 lb 3.2 oz (98.5 kg)  08/03/20 202 lb (91.6 kg)     Objective:    Vital Signs:  BP 124/83   Pulse 76   Ht 5' (1.524 m)   Wt 218 lb (98.9 kg)   BMI 42.58 kg/m   VITAL SIGNS:  reviewed Pleasant female in no acute distress. A&O x 3.  Normal Mood & Affect Non-labored respirations Seems happy and healthy  ==========================================  COVID-19 Education: The signs and symptoms of COVID-19 were discussed with the patient and how to seek care for testing (follow up with PCP or arrange E-visit).   The importance of social distancing was discussed today.  Time:   Today, I have spent 12 minutes with the patient with telehealth technology discussing the above problems.   An additional spent charting (reviewing prior notes, hospital records, studies, labs etc.) Total   Medication Adjustments/Labs and Tests Ordered: Current medicines are reviewed at length with the patient today.  Concerns regarding medicines are outlined above.   Patient Instructions  Medication  Instructions:  None *If you need a refill on your cardiac medications before your next appointment, please call your pharmacy*   Lab Work: None If you have labs (blood work) drawn today and your tests are completely normal, you will receive your results only by: Marland Kitchen MyChart Message (if you have MyChart) OR . A paper copy in the mail If you have any lab test that is abnormal or we need to change your treatment, we will call you to review the results.   Testing/Procedures: If you are thinking of doing some type of cardiovascular screening test, the test recommended with your risk factors of high blood pressure, high cholesterol and weight -> the recommended screening test would be a coronary calcium score.  This is a simple 8-minute CT scan with less x-ray time that chest x-ray basically.  It estimates the amount of coronary artery atherosclerosis that you have.  This helps [place] determine target treatment for cholesterol and blood pressure as well as whether or not we should take aspirin.  If you decide to do something that you would like to do.  Your primary doctor can order it.  If necessary we can be available to help interpret.  You are so active patient for the next 3 years.   Follow-Up: At [facility], you and your health needs are our priority.  As part of our continuing mission to provide you with exceptional heart care, we have created designated Provider Care Teams.  These Care Teams include your primary Cardiologist (physician) and Advanced Practice Providers (APPs -  Physician Assistants and Nurse Practitioners) who all work together to provide you with the care you need, when you need it.  We recommend signing up for the patient portal called "MyChart".  Sign up information is provided on this After Visit Summary.  MyChart is used to connect with patients for Virtual Visits (Telemedicine).  Patients are able to view lab/test results, encounter notes, upcoming appointments, etc.   Non-urgent messages can be sent to your provider as well.   To learn more about what you can do with MyChart, go to [email or web address].    Your next appointment:   As needed Within the next 3 years  The format for your next appointment:   In Person  Provider:   You may see Bryan Lemma, MD or one of the following Advanced Practice Providers on your designated Care Team:    Theodore Demark, PA-C  Joni ReiningKathryn Lawrence, DNP, ANP    Other Instructions Keep working on diet and exercise.  The more able to walk, the better you are we will know if you are actively having heart symptoms.  Consider whether or not you would want to do a screening test like the Coronary Calcium Score -> if your primary doctor is not comfortable ordering it, I will happily order it for you.  It is an out-of-pocket cost of somewhere between $100-150, it is not covered by insurance at this point   It is a pleasure meeting you    Signed, Bryan Lemmaavid Myranda Pavone, MD  10/03/2020 4:18 PM    [facility]

## 2020-10-03 NOTE — Assessment & Plan Note (Signed)
Blood pressure well controlled today. Continue carvedilol.

## 2020-10-03 NOTE — Assessment & Plan Note (Signed)
Based on how significant stress test, and the fact her symptoms are being relieved with change PPI, suspect that this is probably GERD related pain.  The stress test results are reassuring, and she will discuss possible screening Coronary Calcium Score testing with PCP.

## 2020-10-03 NOTE — Assessment & Plan Note (Signed)
Her target LDL should probably be less than 100.  Was pretty close to last check.  She is now on pravastatin.  Follow-up PCP. With obesity and hypertension all HLD, she does need criteria for metabolic syndrome.  We discussed screening evaluation for baseline CAD.  She just had a relatively normal GXT which would exclude significant CAD, but she does have risk factors.  Best option to screen for underlying CAD would be a coronary calcium score.  At this subjectivity ordered by PCP.  If there are abnormal findings, I will be happy to see her back to discuss results.  She is nearing 50 years old, at which point we do start recommending surveillance evaluation.  We discussed Coronary Calcium Score is a out-of-pocket low-level CT scan performed at a moderate number of radiology departments in the [facility].  She will discuss this with her PCP when she is seen back to recheck labs etc.

## 2020-10-03 NOTE — Patient Instructions (Signed)
Medication Instructions:  None *If you need a refill on your cardiac medications before your next appointment, please call your pharmacy*   Lab Work: None If you have labs (blood work) drawn today and your tests are completely normal, you will receive your results only by: Marland Kitchen MyChart Message (if you have MyChart) OR . A paper copy in the mail If you have any lab test that is abnormal or we need to change your treatment, we will call you to review the results.   Testing/Procedures: If you are thinking of doing some type of cardiovascular screening test, the test recommended with your risk factors of high blood pressure, high cholesterol and weight -> the recommended screening test would be a coronary calcium score.  This is a simple 8-minute CT scan with less x-ray time that chest x-ray basically.  It estimates the amount of coronary artery atherosclerosis that you have.  This helps [place] determine target treatment for cholesterol and blood pressure as well as whether or not we should take aspirin.  If you decide to do something that you would like to do.  Your primary doctor can order it.  If necessary we can be available to help interpret.  You are so active patient for the next 3 years.   Follow-Up: At [facility], you and your health needs are our priority.  As part of our continuing mission to provide you with exceptional heart care, we have created designated Provider Care Teams.  These Care Teams include your primary Cardiologist (physician) and Advanced Practice Providers (APPs -  Physician Assistants and Nurse Practitioners) who all work together to provide you with the care you need, when you need it.  We recommend signing up for the patient portal called "MyChart".  Sign up information is provided on this After Visit Summary.  MyChart is used to connect with patients for Virtual Visits (Telemedicine).  Patients are able to view lab/test results, encounter notes, upcoming appointments,  etc.  Non-urgent messages can be sent to your provider as well.   To learn more about what you can do with MyChart, go to [email or web address].    Your next appointment:   As needed Within the next 3 years  The format for your next appointment:   In Person  Provider:   You may see Bryan Lemma, MD or one of the following Advanced Practice Providers on your designated Care Team:    Theodore Demark, PA-C  Joni Reining, DNP, ANP    Other Instructions Keep working on diet and exercise.  The more able to walk, the better you are we will know if you are actively having heart symptoms.  Consider whether or not you would want to do a screening test like the Coronary Calcium Score -> if your primary doctor is not comfortable ordering it, I will happily order it for you.  It is an out-of-pocket cost of somewhere between $100-150, it is not covered by insurance at this point   It is a pleasure meeting you

## 2021-12-11 ENCOUNTER — Emergency Department (HOSPITAL_BASED_OUTPATIENT_CLINIC_OR_DEPARTMENT_OTHER): Payer: BC Managed Care – PPO

## 2021-12-11 ENCOUNTER — Other Ambulatory Visit: Payer: Self-pay

## 2021-12-11 ENCOUNTER — Encounter (HOSPITAL_BASED_OUTPATIENT_CLINIC_OR_DEPARTMENT_OTHER): Payer: Self-pay | Admitting: Emergency Medicine

## 2021-12-11 ENCOUNTER — Emergency Department (HOSPITAL_BASED_OUTPATIENT_CLINIC_OR_DEPARTMENT_OTHER)
Admission: EM | Admit: 2021-12-11 | Discharge: 2021-12-11 | Disposition: A | Discharge status: Home / Self Care | Payer: BC Managed Care – PPO | Attending: Emergency Medicine | Admitting: Emergency Medicine

## 2021-12-11 DIAGNOSIS — M7121 Synovial cyst of popliteal space [Baker], right knee: Secondary | ICD-10-CM | POA: Diagnosis not present

## 2021-12-11 DIAGNOSIS — M79604 Pain in right leg: Secondary | ICD-10-CM | POA: Insufficient documentation

## 2021-12-11 DIAGNOSIS — I10 Essential (primary) hypertension: Secondary | ICD-10-CM | POA: Insufficient documentation

## 2021-12-11 DIAGNOSIS — Z7982 Long term (current) use of aspirin: Secondary | ICD-10-CM | POA: Diagnosis not present

## 2021-12-11 DIAGNOSIS — Z79899 Other long term (current) drug therapy: Secondary | ICD-10-CM | POA: Diagnosis not present

## 2021-12-11 DIAGNOSIS — M25561 Pain in right knee: Secondary | ICD-10-CM | POA: Diagnosis present

## 2021-12-11 MED ORDER — NAPROXEN 500 MG PO TABS: 500.0000 mg | ORAL_TABLET | Freq: Two times a day (BID) | ORAL | 0 refills | Status: AC

## 2021-12-11 NOTE — ED Provider Notes (Signed)
[facility] EMERGENCY DEPARTMENT Provider Note   CSN: [id number] Arrival date & time: 12/11/21  1622     History  Chief Complaint  Patient presents with   Leg Pain    Suzanne Armstrong is a 51 y.o. female past medical history significant for hypertension, hyperlipidemia here for evaluation of right posterior knee pain.  Began approximately 2 to 3 weeks ago.  No recent trauma or injury.  States standing makes it worse.  She was concerned as she had a long plane flight at the end of May this could be a DVT.  No history of DVT, PE.  No leg swelling.  Pain does not radiate to calf.  No redness or warmth to the knee itself.  No fever, chest pain, shortness of breath, numbness, weakness.  No meds PTA  HPI     Home Medications Prior to Admission medications   Medication Sig Start Date End Date Taking? Authorizing Provider  naproxen (NAPROSYN) 500 MG tablet Take 1 tablet (500 mg total) by mouth 2 (two) times daily. 12/11/21  Yes Yolanda Dockendorf A, PA-C  aspirin 81 MG chewable tablet Chew 1 tablet (81 mg total) by mouth daily. 08/03/20   Harlene Salts A, PA-C  carvedilol (COREG) 25 MG tablet Take 25 mg by mouth 2 (two) times daily. 06/18/20   [provider]  EPINEPHrine 0.3 mg/0.3 mL IJ SOAJ injection epinephrine 0.3 mg/0.3 mL injection, auto-injector 05/19/20   [provider]  esomeprazole (NEXIUM) 20 MG capsule Take 20 mg by mouth 2 (two) times daily before a meal.    [provider]  famotidine (PEPCID) 20 MG tablet Take 1 tablet (20 mg total) by mouth 2 (two) times daily. 10/04/18   Arby Barrette, MD  mesalamine (LIALDA) 1.2 g EC tablet Take 1.2 g by mouth in the morning and at bedtime. 02/08/19   [provider]  Multiple Vitamins-Iron (MULTI-VITAMIN/IRON PO) Take 1 capsule by mouth 2 (two) times daily.    [provider]  pravastatin (PRAVACHOL) 20 MG tablet Take 20 mg by mouth at bedtime. 06/18/20   [provider]       Allergies    Other and Strawberry extract    Review of Systems   Review of Systems  Constitutional: Negative.   HENT: Negative.    Respiratory: Negative.    Cardiovascular: Negative.   Gastrointestinal: Negative.   Genitourinary: Negative.   Musculoskeletal:        Right posterior knee pain  Skin: Negative.   Neurological: Negative.   All other systems reviewed and are negative.   Physical Exam Updated Vital Signs BP 136/74   Pulse 72   Temp 97.9 F (36.6 C) (Oral)   Resp 18   Ht 5' (1.524 m)   Wt 98.9 kg   LMP 10/26/2021 (Approximate)   SpO2 99%   BMI 42.58 kg/m  Physical Exam Vitals and nursing note reviewed.  Constitutional:      General: She is not in acute distress.    Appearance: She is well-developed. She is not ill-appearing, toxic-appearing or diaphoretic.  HENT:     Head: Normocephalic and atraumatic.     Nose: Nose normal.     Mouth/Throat:     Mouth: Mucous membranes are moist.  Eyes:     Pupils: Pupils are equal, round, and reactive to light.  Cardiovascular:     Rate and Rhythm: Normal rate.     Pulses: Normal pulses.  Popliteal pulses are 2+ on the right side.       Dorsalis pedis pulses are 2+ on the right side.  Pulmonary:     Effort: Pulmonary effort is normal. No respiratory distress.     Breath sounds: Normal breath sounds.  Abdominal:     General: Bowel sounds are normal. There is no distension.     Palpations: Abdomen is soft.  Musculoskeletal:        General: Normal range of motion.     Cervical back: Normal range of motion.     Comments: No edema, erythema or warmth.  Able to flex and extend without difficulty.  No bony tenderness to femur, knee, tib-fib.  Small, mobile mass posterior knee.  No fluctuance or induration  Skin:    General: Skin is warm and dry.     Capillary Refill: Capillary refill takes less than 2 seconds.     Coloration: Skin is not pale.     Findings: No erythema, lesion or rash.  Neurological:      General: No focal deficit present.     Mental Status: She is alert and oriented to person, place, and time.     Comments: Intact sensation Equal strength Ambulatory  Psychiatric:        Mood and Affect: Mood normal.     ED Results / Procedures / Treatments   Labs (all labs ordered are listed, but only abnormal results are displayed) Labs Reviewed - No data to display  EKG None  Radiology US Venous Img Lower Unilateral Right  Result Date: 12/11/2021 CLINICAL DATA:  Leg pain. EXAM: RIGHT LOWER EXTREMITY VENOUS DOPPLER ULTRASOUND TECHNIQUE: Gray-scale sonography with compression, as well as color and duplex ultrasound, were performed to evaluate the deep venous system(s) from the level of the common femoral vein through the popliteal and proximal calf veins. COMPARISON:  None Available. FINDINGS: VENOUS Normal compressibility of the common femoral, superficial femoral, and popliteal veins, as well as the visualized calf veins. Visualized portions of profunda femoral vein and great saphenous vein unremarkable. No filling defects to suggest DVT on grayscale or color Doppler imaging. Doppler waveforms show normal direction of venous flow, normal respiratory plasticity and response to augmentation. Limited views of the contralateral common femoral vein are unremarkable. OTHER Small avascular fluid connection medially measures 2.7 x 2.4 x 1.6 cm, Limitations: none IMPRESSION: 1. No evidence of right lower extremity DVT. 2. Small fluid collection in the medial knee may represent a Baker cyst in the appropriate clinical setting Electronically Signed   By: Narda Rutherford M.D.   On: 12/11/2021 18:25    Procedures Procedures    Medications Ordered in ED Medications - No data to display  ED Course/ Medical Decision Making/ A&P    51 year old here for evaluation of posterior right knee pain over the last 2 to 3 weeks. No known trauma or injury.  Patient did have a long flight a few weeks ago and  is concerned about DVT.  She has no chest pain, shortness of breath, history of PE or DVT.  Patient has normal musculoskeletal exam.  No bony tenderness.  Neurovascularly intact.  Equal pulses bilaterally.  She has a full range of motion without difficulty.  She has no overlying skin changes to suggest cellulitis, septic joint.  No bony tenderness to suggest fracture, dislocation.  Does have small palpable mass posterior aspect knee which I suspect is cystic in nature.  Low suspicion for abscess.  We will plan on ultrasound  Ultrasound personally viewed and interpreted:  Neg for DVT.  Does show Baker's cyst which is consistent with exam   Do not feel she needs x-ray at this time, ambulatory without bony tenderness.  Discussed with patient.  NSAIDs, ice, rest, follow-up with orthopedics if symptoms do not improve.  Patient agreeable.  The patient has been appropriately medically screened and/or stabilized in the ED. I have low suspicion for any other emergent medical condition which would require further screening, evaluation or treatment in the ED or require inpatient management.  Patient is hemodynamically stable and in no acute distress.  Patient able to ambulate in department prior to ED.  Evaluation does not show acute pathology that would require ongoing or additional emergent interventions while in the emergency department or further inpatient treatment.  I have discussed the diagnosis with the patient and answered all questions.  Pain is been managed while in the emergency department and patient has no further complaints prior to discharge.  Patient is comfortable with plan discussed in room and is stable for discharge at this time.  I have discussed strict return precautions for returning to the emergency department.  Patient was encouraged to follow-up with PCP/specialist refer to at discharge.                           Medical Decision Making Amount and/or Complexity of Data  Reviewed Radiology: ordered and independent interpretation performed. Decision-making details documented in ED Course.  Risk OTC drugs. Prescription drug management. Parenteral controlled substances. Diagnosis or treatment significantly limited by social determinants of health.          Final Clinical Impression(s) / ED Diagnoses Final diagnoses:  Synovial cyst of right popliteal space    Rx / DC Orders ED Discharge Orders          Ordered    naproxen (NAPROSYN) 500 MG tablet  2 times daily        12/11/21 1902              Kegan Mckeithan A, PA-C 12/11/21 1911    Rolan Bucco, MD 12/11/21 2005

## 2021-12-11 NOTE — ED Triage Notes (Signed)
Pt arrives POV ambulatory c/o right leg pain x 2 weeks. Pain is present behind knee. Denies trauma. Standing and walking makes pain worse. Endorses long flight at the end of May.

## 2021-12-11 NOTE — Discharge Instructions (Signed)
Ultrasound today showed a Baker's cyst.  There is no evidence of blood clot which is reassuring.  Your leg does not appear to have an infection.  I would recommend anti-inflammatories.  If your symptoms do not improve follow-up with PCP or Orthopedist

## 2021-12-11 NOTE — ED Notes (Signed)
Discharge instructions and recommendations reviewed with patient. Questions answered. States understanding. Ambulatory . Discharged home

## 2022-04-06 ENCOUNTER — Other Ambulatory Visit: Payer: Self-pay | Admitting: Orthopaedic Surgery

## 2022-04-06 DIAGNOSIS — M25561 Pain in right knee: Secondary | ICD-10-CM

## 2022-04-10 ENCOUNTER — Ambulatory Visit
Admission: RE | Admit: 2022-04-10 | Discharge: 2022-04-10 | Disposition: A | Discharge status: Home / Self Care | Payer: BC Managed Care – PPO | Source: Ambulatory Visit | Attending: Orthopaedic Surgery | Admitting: Orthopaedic Surgery

## 2022-04-10 DIAGNOSIS — M25561 Pain in right knee: Secondary | ICD-10-CM

## 2022-11-02 ENCOUNTER — Emergency Department (HOSPITAL_BASED_OUTPATIENT_CLINIC_OR_DEPARTMENT_OTHER): Payer: BC Managed Care – PPO

## 2022-11-02 ENCOUNTER — Emergency Department (HOSPITAL_BASED_OUTPATIENT_CLINIC_OR_DEPARTMENT_OTHER)
Admission: EM | Admit: 2022-11-02 | Discharge: 2022-11-02 | Disposition: A | Discharge status: Home / Self Care | Payer: BC Managed Care – PPO | Attending: Emergency Medicine | Admitting: Emergency Medicine

## 2022-11-02 ENCOUNTER — Other Ambulatory Visit: Payer: Self-pay

## 2022-11-02 ENCOUNTER — Encounter (HOSPITAL_BASED_OUTPATIENT_CLINIC_OR_DEPARTMENT_OTHER): Payer: Self-pay | Admitting: Emergency Medicine

## 2022-11-02 DIAGNOSIS — Z7982 Long term (current) use of aspirin: Secondary | ICD-10-CM | POA: Diagnosis not present

## 2022-11-02 DIAGNOSIS — T7840XA Allergy, unspecified, initial encounter: Secondary | ICD-10-CM | POA: Insufficient documentation

## 2022-11-02 DIAGNOSIS — I10 Essential (primary) hypertension: Secondary | ICD-10-CM | POA: Diagnosis not present

## 2022-11-02 LAB — CBC WITH DIFFERENTIAL/PLATELET
Abs Immature Granulocytes: 0.01 10*3/uL (ref 0.00–0.07)
Basophils Absolute: 0 10*3/uL (ref 0.0–0.1)
Basophils Relative: 0 %
Eosinophils Absolute: 0.5 10*3/uL (ref 0.0–0.5)
Eosinophils Relative: 9 %
HCT: 39.2 % (ref 36.0–46.0)
Hemoglobin: 12 g/dL (ref 12.0–15.0)
Immature Granulocytes: 0 %
Lymphocytes Relative: 29 %
Lymphs Abs: 1.5 10*3/uL (ref 0.7–4.0)
MCH: 20.8 pg — ABNORMAL LOW (ref 26.0–34.0)
MCHC: 30.6 g/dL (ref 30.0–36.0)
MCV: 67.9 fL — ABNORMAL LOW (ref 80.0–100.0)
Monocytes Absolute: 0.5 10*3/uL (ref 0.1–1.0)
Monocytes Relative: 9 %
Neutro Abs: 2.7 10*3/uL (ref 1.7–7.7)
Neutrophils Relative %: 53 %
Platelets: 240 10*3/uL (ref 150–400)
RBC: 5.77 MIL/uL — ABNORMAL HIGH (ref 3.87–5.11)
RDW: 18.7 % — ABNORMAL HIGH (ref 11.5–15.5)
WBC: 5.1 10*3/uL (ref 4.0–10.5)
nRBC: 0 % (ref 0.0–0.2)

## 2022-11-02 LAB — BASIC METABOLIC PANEL
Anion gap: 10 (ref 5–15)
BUN: 10 mg/dL (ref 6–20)
CO2: 24 mmol/L (ref 22–32)
Calcium: 9.2 mg/dL (ref 8.9–10.3)
Chloride: 102 mmol/L (ref 98–111)
Creatinine, Ser: 0.95 mg/dL (ref 0.44–1.00)
GFR, Estimated: >60 mL/min (ref >60)
Glucose, Bld: 94 mg/dL (ref 70–99)
Potassium: 4.2 mmol/L (ref 3.5–5.1)
Sodium: 136 mmol/L (ref 135–145)

## 2022-11-02 MED ORDER — SODIUM CHLORIDE 0.9 % IV BOLUS
500.0000 mL | Freq: Once | INTRAVENOUS | Status: AC
  Administered 2022-11-02: 500 mL via INTRAVENOUS

## 2022-11-02 MED ORDER — FAMOTIDINE IN NACL 20-0.9 MG/50ML-% IV SOLN
20.0000 mg | Freq: Once | INTRAVENOUS | Status: AC
  Administered 2022-11-02: 20 mg via INTRAVENOUS
  Filled 2022-11-02: qty 50

## 2022-11-02 MED ORDER — PREDNISONE 10 MG PO TABS: 20.0000 mg | ORAL_TABLET | Freq: Every day | ORAL | 0 refills | Status: DC

## 2022-11-02 MED ORDER — METHYLPREDNISOLONE SODIUM SUCC 125 MG IJ SOLR
125.0000 mg | Freq: Once | INTRAMUSCULAR | Status: AC
  Administered 2022-11-02: 125 mg via INTRAVENOUS
  Filled 2022-11-02: qty 2

## 2022-11-02 NOTE — Discharge Instructions (Addendum)
Please follow-up with your primary care provider any recent symptoms and ER visit.  Today your labs and imaging were all reassuring and before and after sumatriptan he stated that he felt much better after seeing the Benadryl from EMS.  During her stay here your vitals remained reassuring and there was not any wheezing on exam or facial or neck swelling.  Please pick up the prednisone I prescribed for you to help with inflammation and if symptoms worsen please return to ER.

## 2022-11-02 NOTE — ED Triage Notes (Signed)
EMS reports pt was at work and began having throat swelling and ShoB, took 25mg  PO Benadryl and EPI PEN, EMS gave 25mg  Benadryl IV enroute, pt believes there may have been nuts or strawberries in the smoothie she was drinking

## 2022-11-02 NOTE — ED Provider Notes (Signed)
[place] [facility] Provider Note   CSN: [id number] Arrival date & time: 11/02/22  1441     History  Chief Complaint  Patient presents with   Allergic Reaction    Suzanne Armstrong is a 52 y.o. female history of UC, hypertension, hyperlipidemia presented after having allergic reaction to a tropical smoothie today.  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  Patient is stated that she felt her throat closing up and had trouble breathing.  Patient took 25 mg of Benadryl but her friend had along with an EpiPen which seem to help however patient still endorsed shortness of breath and so EMS was called and EMS gave 25 mg Benadryl through the IV.  Patient states now that she does not have any symptoms and feels much better.  Patient denied any chest pain, nausea/vomiting, abdominal pain, fevers, vision changes, face swelling, dysphagia  Home Medications Prior to Admission medications   Medication Sig Start Date End Date Taking? Authorizing Provider  predniSONE (DELTASONE) 10 MG tablet Take 2 tablets (20 mg total) by mouth daily. 11/02/22  Yes Netta Corrigan, PA-C  aspirin 81 MG chewable tablet Chew 1 tablet (81 mg total) by mouth daily. 08/03/20   Harlene Salts A, PA-C  carvedilol (COREG) 25 MG tablet Take 25 mg by mouth 2 (two) times daily. 06/18/20   [provider]  EPINEPHrine 0.3 mg/0.3 mL IJ SOAJ injection epinephrine 0.3 mg/0.3 mL injection, auto-injector 05/19/20   [provider]  esomeprazole (NEXIUM) 20 MG capsule Take 20 mg by mouth 2 (two) times daily before a meal.    [provider]  famotidine (PEPCID) 20 MG tablet Take 1 tablet (20 mg total) by mouth 2 (two) times daily. 10/04/18   Arby Barrette, MD  mesalamine (LIALDA) 1.2 g EC tablet Take 1.2 g by mouth in the morning and at bedtime. 02/08/19   [provider]  Multiple Vitamins-Iron  (MULTI-VITAMIN/IRON PO) Take 1 capsule by mouth 2 (two) times daily.    [provider]  naproxen (NAPROSYN) 500 MG tablet Take 1 tablet (500 mg total) by mouth 2 (two) times daily. 12/11/21   Henderly, Britni A, PA-C  pravastatin (PRAVACHOL) 20 MG tablet Take 20 mg by mouth at bedtime. 06/18/20   [provider]      Allergies    Other and Strawberry extract    Review of Systems   Review of Systems See HPI Physical Exam Updated Vital Signs BP 109/71   Pulse 74   Temp 97.9 F (36.6 C) (Oral)   Resp 18   Ht 5' (1.524 m)   Wt 94.8 kg   SpO2 100%   BMI 40.82 kg/m  Physical Exam Vitals reviewed.  Constitutional:      General: She is not in acute distress. HENT:     Head: Normocephalic and atraumatic.     Comments: No facial swelling noted    Mouth/Throat:     Mouth: Mucous membranes are moist.     Pharynx: No posterior oropharyngeal erythema.     Comments: No edema noted Able to tolerate secretions Oral floor is not elevated Uvula is not edematous Eyes:     Extraocular Movements: Extraocular movements intact.     Conjunctiva/sclera: Conjunctivae normal.     Pupils: Pupils are equal, round, and reactive to light.  Neck:     Comments: No neck swelling noted Cardiovascular:  Rate and Rhythm: Normal rate and regular rhythm.     Pulses: Normal pulses.     Heart sounds: Normal heart sounds.     Comments: 2+ bilateral radial/dorsalis pedis pulses with regular rate Pulmonary:     Effort: Pulmonary effort is normal. No respiratory distress.     Breath sounds: Normal breath sounds.  Abdominal:     Palpations: Abdomen is soft.     Tenderness: There is no abdominal tenderness. There is no guarding or rebound.  Musculoskeletal:        General: Normal range of motion.     Cervical back: Normal range of motion and neck supple.     Comments: 5 out of 5 bilateral grip/leg extension strength  Skin:    General: Skin is warm and dry.     Capillary Refill:  Capillary refill takes less than 2 seconds.  Neurological:     General: No focal deficit present.     Mental Status: She is alert and oriented to person, place, and time.     Comments: Sensation intact in all 4 limbs  Psychiatric:        Mood and Affect: Mood normal.     ED Results / Procedures / Treatments   Labs (all labs ordered are listed, but only abnormal results are displayed) Labs Reviewed  CBC WITH DIFFERENTIAL/PLATELET - Abnormal; Notable for the following components:      Result Value   RBC 5.77 (*)    MCV 67.9 (*)    MCH 20.8 (*)    RDW 18.7 (*)    All other components within normal limits  BASIC METABOLIC PANEL    EKG None  Radiology DG Chest Port 1 View  Result Date: 11/02/2022 CLINICAL DATA:  Anaphylaxis EXAM: PORTABLE CHEST 1 VIEW COMPARISON:  Portable exam 1526 hours compared to 08/03/2020 FINDINGS: Upper normal size of cardiac silhouette. Mediastinal contours and pulmonary vascularity normal. Lungs clear. No pulmonary infiltrate, pleural effusion, pneumothorax or acute osseous findings. IMPRESSION: No acute abnormalities. Electronically Signed   By: Ulyses Southward M.D.   On: 11/02/2022 15:38    Procedures Procedures    Medications Ordered in ED Medications  sodium chloride 0.9 % bolus 500 mL (500 mLs Intravenous New Bag/Given 11/02/22 1531)  methylPREDNISolone sodium succinate (SOLU-MEDROL) 125 mg/2 mL injection 125 mg (125 mg Intravenous Given 11/02/22 1521)  famotidine (PEPCID) IVPB 20 mg premix (20 mg Intravenous New Bag/Given 11/02/22 1532)    ED Course/ Medical Decision Making/ A&P                             Medical Decision Making  Suzanne Armstrong 52 y.o. presented today for allergic reaction. Working DDx that I considered at this time includes, but not limited to, allergic reaction, anaphylaxis, electrolyte abnormalities, airway compromise.  R/o DDx: Electrolyte abnormalities, airway compromise, anaphylaxis: These are considered less likely due to  history of present illness and physical exam findings  Review of prior external notes: 12/11/2021 ED  Unique Tests and My Interpretation:  EKG: Sinus to 74 bpm, no ST abnormalities or blocks noted Chest x-ray: No acute cardiopulmonary changes CBC: Unremarkable BMP: Unremarkable  Discussion with Independent Historian: None  Discussion of Management of Tests: None  Risk: Medium: prescription drug management  Risk Stratification Score: None  Plan: Patient presented for allergic reaction. On exam patient was in no acute distress stable vitals upon arrival to ER.  Patient had received Benadryl p.o. and  EpiPen by her friend along with Benadryl to the IV before reaching ER.  Patient stated that she was asymptomatic during initial encounter and patient did not show any signs of anaphylaxis on exam or any concerning signs.  Patient will be given fluids along with Solu-Medrol and Pepcid and we will obtain labs with chest x-ray.  Patient will be monitored and if symptoms do not return may be discharged with outpatient follow-up.  On recheck patient stated that she continued to feel well and feels that she may be discharged.  Patient is vitals have been reassuring along with physical exam during her stay in the ED and I feel it was safe to discharge patient with outpatient follow-up.  Patient will be given prednisone as a prescription and encouraged follow-up with her primary care provider and to monitor symptoms and take Benadryl if needed.  Patient was given return precautions. Patient stable for discharge at this time.  Patient verbalized understanding of plan.         Final Clinical Impression(s) / ED Diagnoses Final diagnoses:  Allergic reaction, initial encounter    Rx / DC Orders ED Discharge Orders          Ordered    predniSONE (DELTASONE) 10 MG tablet  Daily        11/02/22 1614              Remi Deter 11/02/22 1626    Alvira Monday, MD 11/03/22  1357

## 2023-02-08 ENCOUNTER — Emergency Department (HOSPITAL_BASED_OUTPATIENT_CLINIC_OR_DEPARTMENT_OTHER): Payer: BC Managed Care – PPO

## 2023-02-08 ENCOUNTER — Emergency Department (HOSPITAL_BASED_OUTPATIENT_CLINIC_OR_DEPARTMENT_OTHER)
Admission: EM | Admit: 2023-02-08 | Discharge: 2023-02-08 | Disposition: A | Discharge status: Home / Self Care | Payer: BC Managed Care – PPO | Attending: Emergency Medicine | Admitting: Emergency Medicine

## 2023-02-08 ENCOUNTER — Other Ambulatory Visit: Payer: Self-pay

## 2023-02-08 DIAGNOSIS — M25562 Pain in left knee: Secondary | ICD-10-CM | POA: Insufficient documentation

## 2023-02-08 DIAGNOSIS — S7012XA Contusion of left thigh, initial encounter: Secondary | ICD-10-CM | POA: Insufficient documentation

## 2023-02-08 DIAGNOSIS — Y92410 Unspecified street and highway as the place of occurrence of the external cause: Secondary | ICD-10-CM | POA: Insufficient documentation

## 2023-02-08 DIAGNOSIS — M549 Dorsalgia, unspecified: Secondary | ICD-10-CM | POA: Diagnosis not present

## 2023-02-08 DIAGNOSIS — M25552 Pain in left hip: Secondary | ICD-10-CM | POA: Diagnosis not present

## 2023-02-08 DIAGNOSIS — Z7982 Long term (current) use of aspirin: Secondary | ICD-10-CM | POA: Insufficient documentation

## 2023-02-08 DIAGNOSIS — T148XXA Other injury of unspecified body region, initial encounter: Secondary | ICD-10-CM

## 2023-02-08 DIAGNOSIS — S79922A Unspecified injury of left thigh, initial encounter: Secondary | ICD-10-CM | POA: Diagnosis present

## 2023-02-08 DIAGNOSIS — R519 Headache, unspecified: Secondary | ICD-10-CM | POA: Diagnosis not present

## 2023-02-08 MED ORDER — ACETAMINOPHEN 325 MG PO TABS
650.0000 mg | ORAL_TABLET | Freq: Once | ORAL | Status: AC
  Administered 2023-02-08: 650 mg via ORAL
  Filled 2023-02-08: qty 2

## 2023-02-08 MED ORDER — IBUPROFEN 800 MG PO TABS
800.0000 mg | ORAL_TABLET | Freq: Once | ORAL | Status: AC
  Administered 2023-02-08: 800 mg via ORAL
  Filled 2023-02-08: qty 1

## 2023-02-08 MED ORDER — METHOCARBAMOL 500 MG PO TABS: 500.0000 mg | ORAL_TABLET | Freq: Two times a day (BID) | ORAL | 0 refills | Status: DC
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
## 2023-02-08 NOTE — ED Notes (Signed)
Patient transported to X-ray 

## 2023-02-08 NOTE — Discharge Instructions (Addendum)
Please use Tylenol or ibuprofen for pain.  You may use 600 mg ibuprofen every 6 hours or 1000 mg of Tylenol every 6 hours.  You may choose to alternate between the 2.  This would be most effective.  Not to exceed 4 g of Tylenol within 24 hours.  Not to exceed 3200 mg ibuprofen 24 hours.  You can use the muscle relaxant I am prescribing in addition to the above to help with any breakthrough pain.  You can take it up to twice daily.  It is safe to take at night, but I would be cautious taking it during the day as it can cause some drowsiness.  Make sure that you are feeling awake and alert before you get behind the wheel of a car or operate a motor vehicle.  It is not a narcotic pain medication so you are able to take it if it is not making you drowsy and still pilot a vehicle or machinery safely.  This is the radiologist read on the lateral side of the knee that we discussed:  Subtle area of lucency and sclerosis  along the lateral femoral condyle. This a differential. A subtle  osteochondral process is not excluded.    If you have any ongoing swelling, pain on the lateral side of the knee recommend following up for repeat x-ray or further characterization with.
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
## 2023-09-19 IMAGING — US US EXTREM LOW VENOUS*R*
1 series · 14 of 24 positions shown · non-contrast
Comparison: None Available.

CLINICAL DATA: Leg pain.

EXAM:
RIGHT LOWER EXTREMITY VENOUS DOPPLER ULTRASOUND
TECHNIQUE: Gray-scale sonography with compression, as well as color and duplex
ultrasound, were performed to evaluate the deep venous system(s)
from the level of the common femoral vein through the popliteal and
proximal calf veins.

[Series 1: us extrem low venous*right* · 14 of 46 slices shown]
[im 1/46]
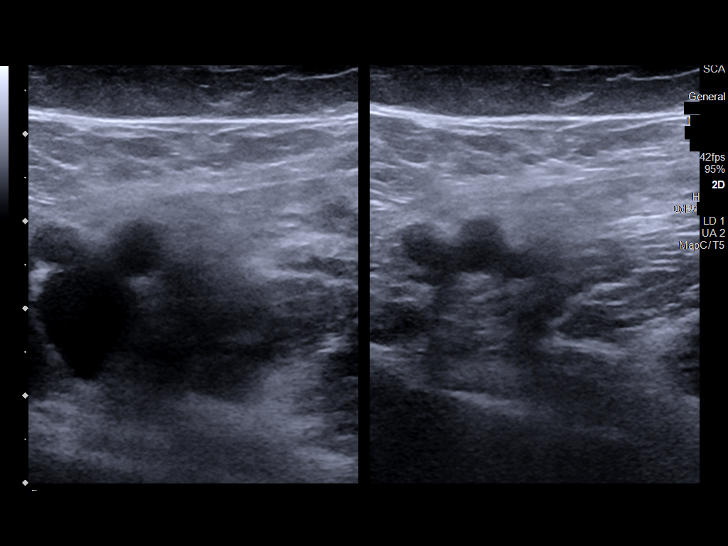
[im 4/46]
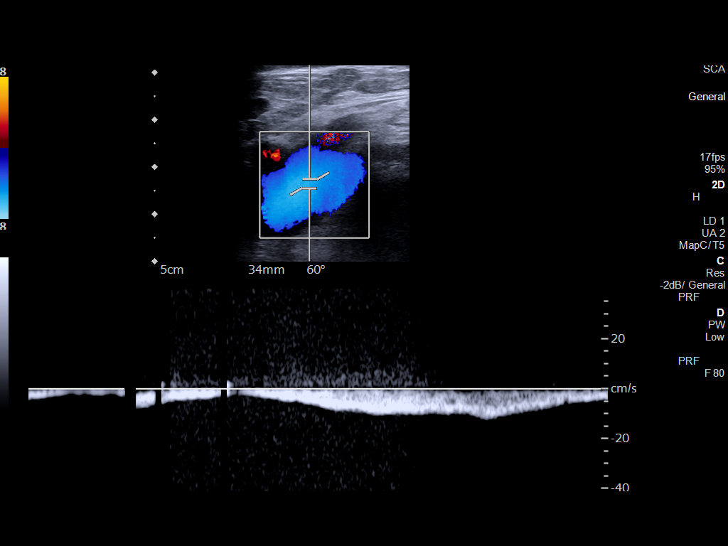
[im 8/46]
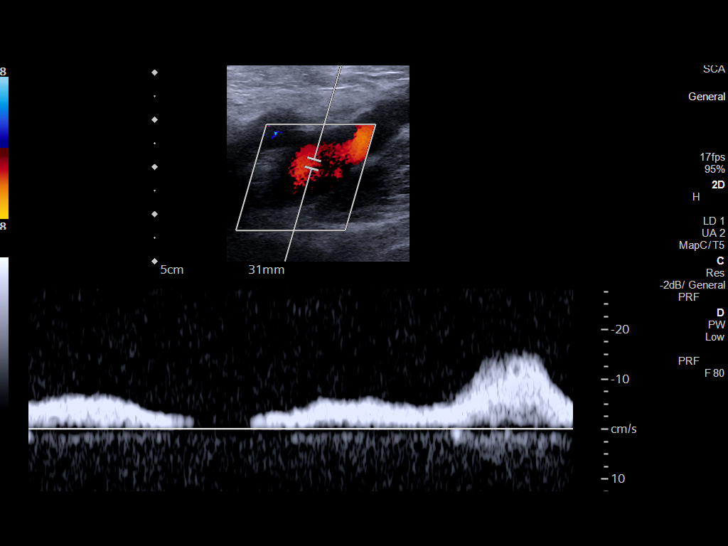
[im 12/46]
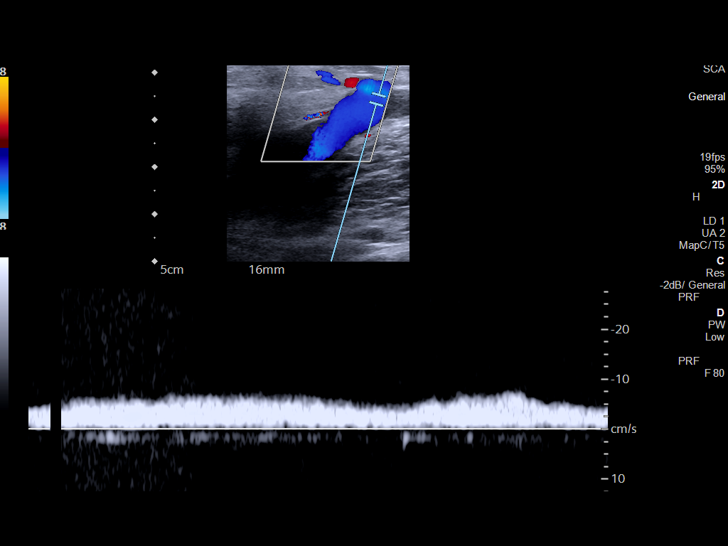
[im 14/46]
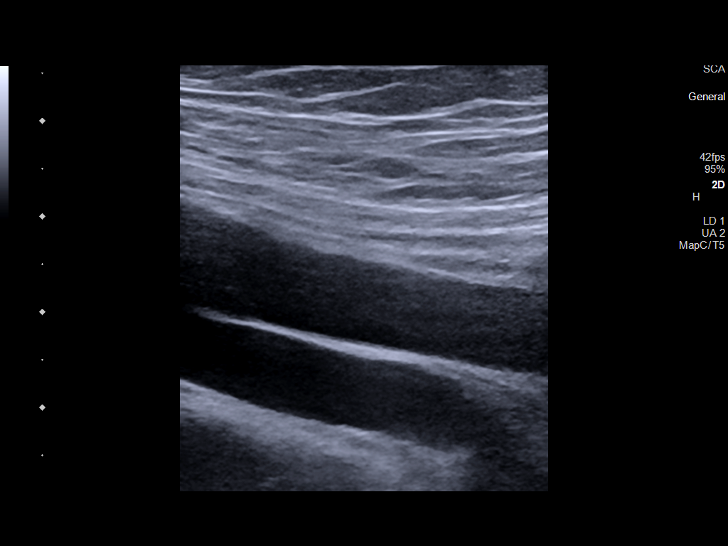
[im 18/46]
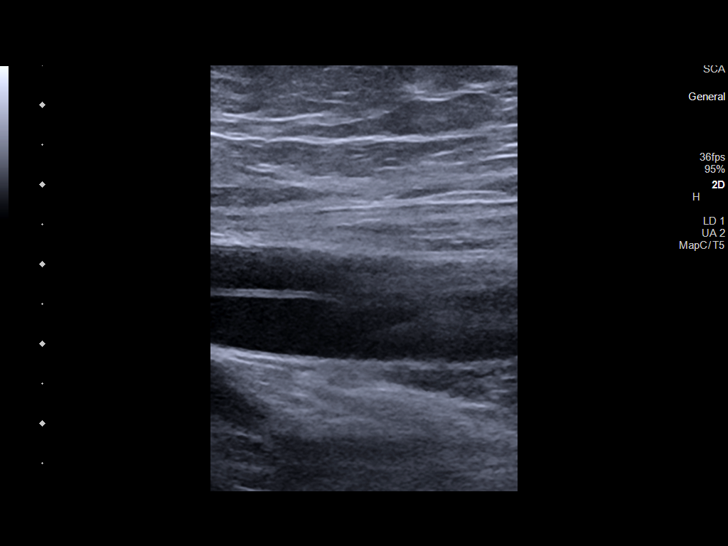
[im 22/46]
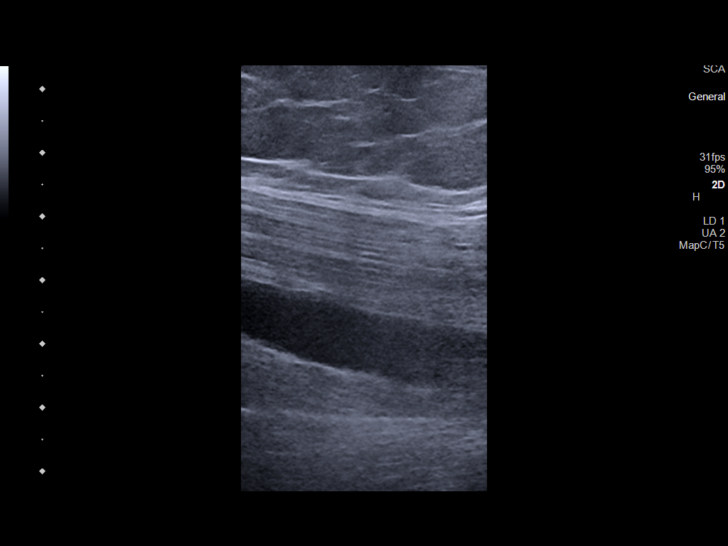
[im 24/46]
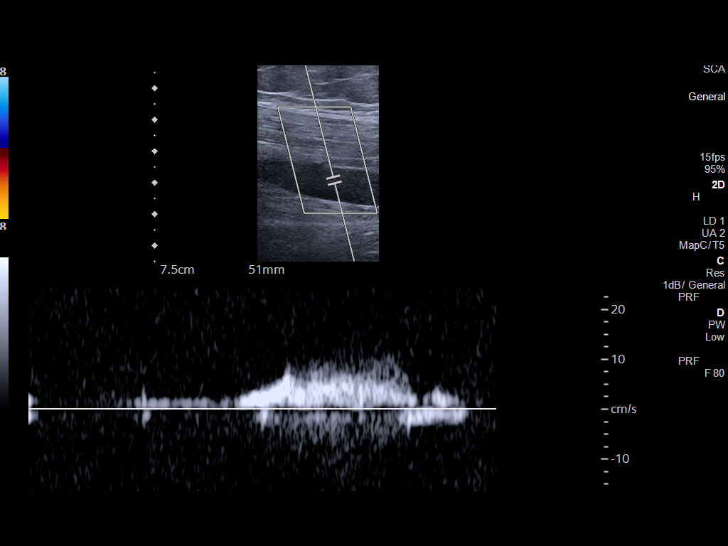
[im 28/46]
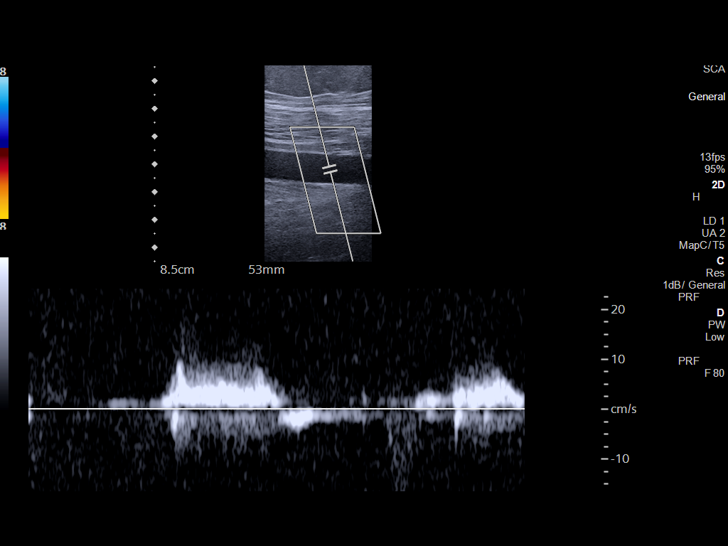
[im 32/46]
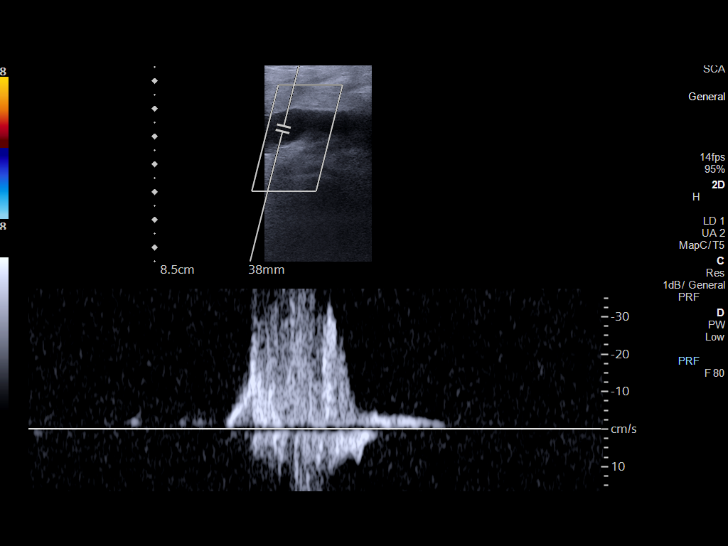
[im 36/46]
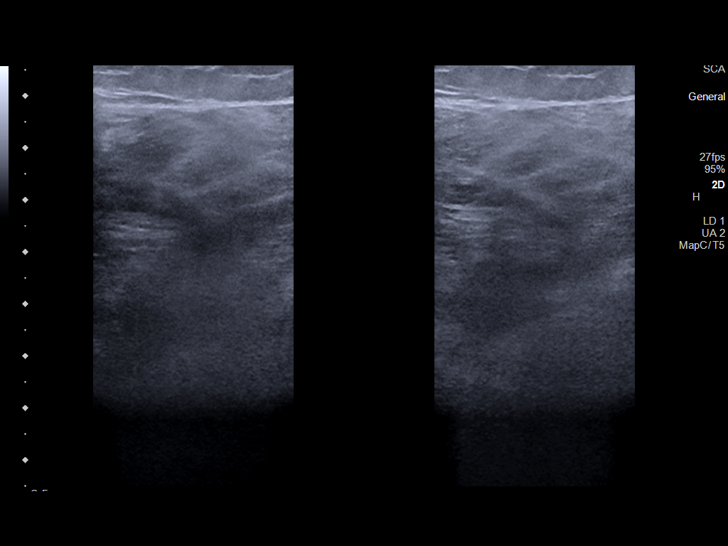
[im 38/46]
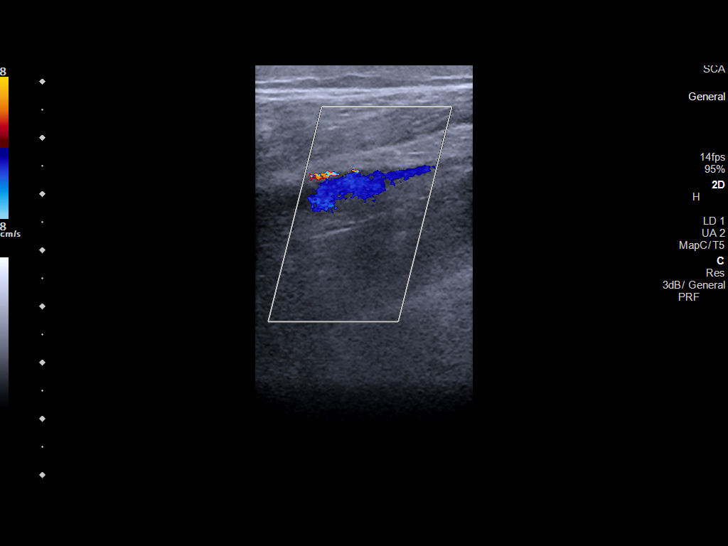
[im 42/46]
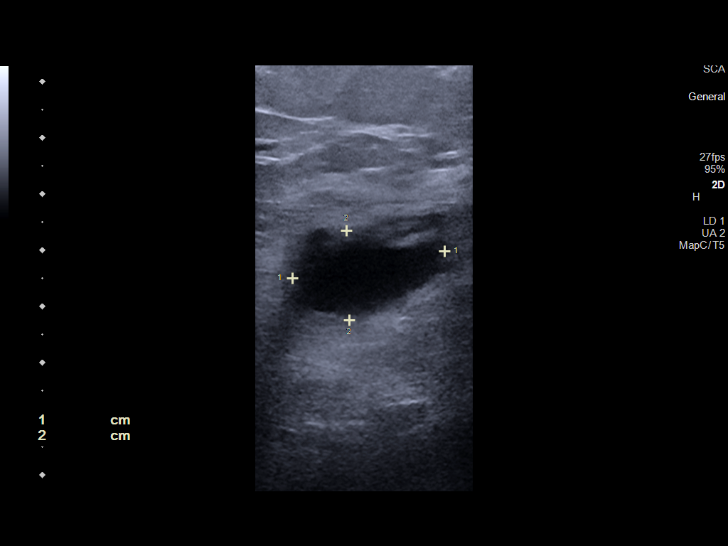
[im 46/46]
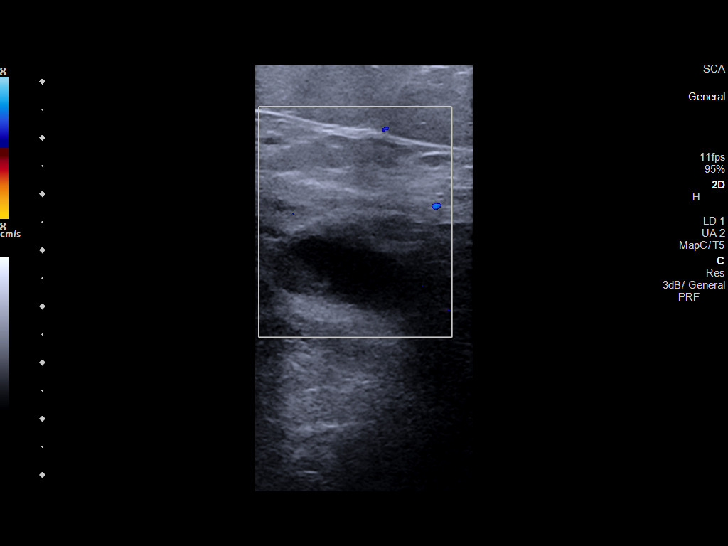

[14 of 24 positions shown; findings below may reference images not displayed]

FINDINGS: VENOUS

Normal compressibility of the common femoral, superficial femoral,
and popliteal veins, as well as the visualized calf veins.
Visualized portions of profunda femoral vein and great saphenous
vein unremarkable. No filling defects to suggest DVT on grayscale or
color Doppler imaging. Doppler waveforms show normal direction of
venous flow, normal respiratory plasticity and response to
augmentation.

Limited views of the contralateral common femoral vein are
unremarkable.

OTHER

Small avascular fluid connection medially measures 2.7 x 2.4 x
cm,

Limitations: none
IMPRESSION: 1. No evidence of right lower extremity DVT.
2. Small fluid collection in the medial knee may represent a Baker
cyst in the appropriate clinical setting

## 2023-12-29 ENCOUNTER — Emergency Department (HOSPITAL_BASED_OUTPATIENT_CLINIC_OR_DEPARTMENT_OTHER)

## 2023-12-29 ENCOUNTER — Encounter (HOSPITAL_BASED_OUTPATIENT_CLINIC_OR_DEPARTMENT_OTHER): Payer: Self-pay | Admitting: Emergency Medicine

## 2023-12-29 ENCOUNTER — Emergency Department (HOSPITAL_BASED_OUTPATIENT_CLINIC_OR_DEPARTMENT_OTHER)
Admission: EM | Admit: 2023-12-29 | Discharge: 2023-12-29 | Disposition: A | Discharge status: Home / Self Care | Attending: Emergency Medicine | Admitting: Emergency Medicine

## 2023-12-29 ENCOUNTER — Other Ambulatory Visit: Payer: Self-pay

## 2023-12-29 DIAGNOSIS — R1030 Lower abdominal pain, unspecified: Secondary | ICD-10-CM | POA: Diagnosis present

## 2023-12-29 DIAGNOSIS — N83201 Unspecified ovarian cyst, right side: Secondary | ICD-10-CM | POA: Insufficient documentation

## 2023-12-29 DIAGNOSIS — I10 Essential (primary) hypertension: Secondary | ICD-10-CM | POA: Diagnosis not present

## 2023-12-29 DIAGNOSIS — Z7982 Long term (current) use of aspirin: Secondary | ICD-10-CM | POA: Diagnosis not present

## 2023-12-29 DIAGNOSIS — Z79899 Other long term (current) drug therapy: Secondary | ICD-10-CM | POA: Diagnosis not present

## 2023-12-29 LAB — CBC WITH DIFFERENTIAL/PLATELET
Abs Immature Granulocytes: 0.02 10*3/uL (ref 0.00–0.07)
Basophils Absolute: 0 10*3/uL (ref 0.0–0.1)
Basophils Relative: 0 %
Eosinophils Absolute: 0.3 10*3/uL (ref 0.0–0.5)
Eosinophils Relative: 4 %
HCT: 38.8 % (ref 36.0–46.0)
Hemoglobin: 12 g/dL (ref 12.0–15.0)
Immature Granulocytes: 0 %
Lymphocytes Relative: 29 %
Lymphs Abs: 1.9 10*3/uL (ref 0.7–4.0)
MCH: 21.4 pg — ABNORMAL LOW (ref 26.0–34.0)
MCHC: 30.9 g/dL (ref 30.0–36.0)
MCV: 69.2 fL — ABNORMAL LOW (ref 80.0–100.0)
Monocytes Absolute: 0.5 10*3/uL (ref 0.1–1.0)
Monocytes Relative: 7 %
Neutro Abs: 3.9 10*3/uL (ref 1.7–7.7)
Neutrophils Relative %: 60 %
Platelets: 255 10*3/uL (ref 150–400)
RBC: 5.61 MIL/uL — ABNORMAL HIGH (ref 3.87–5.11)
RDW: 16.9 % — ABNORMAL HIGH (ref 11.5–15.5)
WBC: 6.5 10*3/uL (ref 4.0–10.5)
nRBC: 0 % (ref 0.0–0.2)

## 2023-12-29 LAB — COMPREHENSIVE METABOLIC PANEL WITH GFR
ALT: 12 U/L (ref 0–44)
AST: 17 U/L (ref 15–41)
Albumin: 4.4 g/dL (ref 3.5–5.0)
Alkaline Phosphatase: 66 U/L (ref 38–126)
Anion gap: 10 (ref 5–15)
BUN: 12 mg/dL (ref 6–20)
CO2: 27 mmol/L (ref 22–32)
Calcium: 9.2 mg/dL (ref 8.9–10.3)
Chloride: 102 mmol/L (ref 98–111)
Creatinine, Ser: 0.97 mg/dL (ref 0.44–1.00)
GFR, Estimated: >60 mL/min (ref >60)
Glucose, Bld: 75 mg/dL (ref 70–99)
Potassium: 4.5 mmol/L (ref 3.5–5.1)
Sodium: 138 mmol/L (ref 135–145)
Total Bilirubin: 0.2 mg/dL (ref 0.0–1.2)
Total Protein: 7.1 g/dL (ref 6.5–8.1)

## 2023-12-29 LAB — URINALYSIS, ROUTINE W REFLEX MICROSCOPIC
Bilirubin Urine: NEGATIVE
Glucose, UA: NEGATIVE mg/dL
Hgb urine dipstick: NEGATIVE
Ketones, ur: NEGATIVE mg/dL
Leukocytes,Ua: NEGATIVE
Nitrite: NEGATIVE
Protein, ur: NEGATIVE mg/dL
Specific Gravity, Urine: <=1.005 (ref 1.005–1.030)
pH: 6 (ref 5.0–8.0)

## 2023-12-29 LAB — HCG, SERUM, QUALITATIVE: Preg, Serum: NEGATIVE

## 2023-12-29 LAB — LIPASE, BLOOD: Lipase: 27 U/L (ref 11–51)

## 2023-12-29 MED ORDER — IOHEXOL 300 MG/ML  SOLN
100.0000 mL | Freq: Once | INTRAMUSCULAR | Status: AC | PRN
  Administered 2023-12-29: 100 mL via INTRAVENOUS

## 2023-12-29 MED ORDER — MORPHINE SULFATE (PF) 4 MG/ML IV SOLN
4.0000 mg | Freq: Once | INTRAVENOUS | Status: AC
  Administered 2023-12-29: 4 mg via INTRAVENOUS
  Filled 2023-12-29: qty 1

## 2023-12-29 MED ORDER — ONDANSETRON HCL 4 MG/2ML IJ SOLN
4.0000 mg | Freq: Once | INTRAMUSCULAR | Status: AC
  Administered 2023-12-29: 4 mg via INTRAVENOUS
  Filled 2023-12-29: qty 2

## 2023-12-29 NOTE — Discharge Instructions (Addendum)
 Your CT today shows a right ovarian cyst. Please follow up with your GYN or PCP regarding your visit today. Return to the ER for worsening or concerning symptoms.

## 2023-12-29 NOTE — ED Provider Notes (Signed)
 [place] [facility] Provider Note   CSN: [id number] Arrival date & time: 12/29/23  1422     Patient presents with: Groin Pain   Suzanne Armstrong is a 53 y.o. female.   53 y.o. female  was evaluated in triage.  Pt complains of groin pain, more like pelvic pain, onset this morning while making coffee. Urinary frequency without dysuria, no changes in bowel habits, no discharge or concern for STI. Hx of UC but does not feel similar.  Prior abdominal surgeries include tubal ligation, cholecystectomy        Prior to Admission medications   Medication Sig Start Date End Date Taking? Authorizing Provider  aspirin  81 MG chewable tablet Chew 1 tablet (81 mg total) by mouth daily. 08/03/20   Donah Riis A, PA-C  carvedilol (COREG) 25 MG tablet Take 25 mg by mouth 2 (two) times daily. 06/18/20   [provider]  EPINEPHrine 0.3 mg/0.3 mL IJ SOAJ injection epinephrine 0.3 mg/0.3 mL injection, auto-injector 05/19/20   [provider]  esomeprazole  (NEXIUM ) 20 MG capsule Take 20 mg by mouth 2 (two) times daily before a meal.    [provider]  famotidine  (PEPCID ) 20 MG tablet Take 1 tablet (20 mg total) by mouth 2 (two) times daily. 10/04/18   Armenta Canning, MD  mesalamine (LIALDA) 1.2 g EC tablet Take 1.2 g by mouth in the morning and at bedtime. 02/08/19   [provider]  methocarbamol  (ROBAXIN ) 500 MG tablet Take 1 tablet (500 mg total) by mouth 2 (two) times daily. 02/08/23   Prosperi, Christian H, PA-C  Multiple Vitamins-Iron (MULTI-VITAMIN/IRON PO) Take 1 capsule by mouth 2 (two) times daily.    [provider]  naproxen  (NAPROSYN ) 500 MG tablet Take 1 tablet (500 mg total) by mouth 2 (two) times daily. 12/11/21   Henderly, Britni A, PA-C  pravastatin (PRAVACHOL) 20 MG tablet Take 20 mg by mouth at bedtime. 06/18/20   [provider]  predniSONE  (DELTASONE ) 10 MG tablet Take 2 tablets (20 mg total) by  mouth daily. 11/02/22   Victor Lynwood DASEN, PA-C    Allergies: Other and Strawberry extract    Review of Systems Negative except as per HPI Updated Vital Signs BP 116/67   Pulse 70   Temp 97.6 F (36.4 C) (Oral)   Resp 20   Ht 5' (1.524 m)   Wt 96.2 kg   SpO2 98%   BMI 41.40 kg/m   Physical Exam Vitals and nursing note reviewed.  Constitutional:      General: She is not in acute distress.    Appearance: She is well-developed. She is not diaphoretic.  HENT:     Head: Normocephalic and atraumatic.  Pulmonary:     Effort: Pulmonary effort is normal.  Abdominal:     Palpations: Abdomen is soft.     Tenderness: There is generalized abdominal tenderness and tenderness in the right lower quadrant, suprapubic area and left lower quadrant. There is no guarding or rebound.     Comments: Generalized tenderness more localized to lower abdomen   Musculoskeletal:     Right lower leg: No edema.     Left lower leg: No edema.  Skin:    General: Skin is warm and dry.     Findings: No erythema or rash.  Neurological:     Mental Status: She is alert and oriented to person, place, and time.  Psychiatric:        Behavior:  Behavior normal.     (all labs ordered are listed, but only abnormal results are displayed) Labs Reviewed  CBC WITH DIFFERENTIAL/PLATELET - Abnormal; Notable for the following components:      Result Value   RBC 5.61 (*)    MCV 69.2 (*)    MCH 21.4 (*)    RDW 16.9 (*)    All other components within normal limits  COMPREHENSIVE METABOLIC PANEL WITH GFR  LIPASE, BLOOD  URINALYSIS, ROUTINE W REFLEX MICROSCOPIC  HCG, SERUM, QUALITATIVE    EKG: None  Radiology: CT ABDOMEN PELVIS W CONTRAST Result Date: 12/29/2023 CLINICAL DATA:  Left lower quadrant abdominal pain. EXAM: CT ABDOMEN AND PELVIS WITH CONTRAST TECHNIQUE: Multidetector CT imaging of the abdomen and pelvis was performed using the standard protocol following bolus administration of intravenous contrast.  RADIATION DOSE REDUCTION: This exam was performed according to the departmental dose-optimization program which includes automated exposure control, adjustment of the mA and/or kV according to patient size and/or use of iterative reconstruction technique. CONTRAST:  OMNIPAQUE  IOHEXOL  300 MG/ML  SOLN COMPARISON:  None Available. FINDINGS: Lower chest: The visualized lung bases are clear. No intra-abdominal free air or free fluid. Hepatobiliary: The liver is unremarkable.  Cholecystectomy. Pancreas: Unremarkable. No pancreatic ductal dilatation or surrounding inflammatory changes. Spleen: Normal in size without focal abnormality. Adrenals/Urinary Tract: The adrenal glands unremarkable. The kidneys, visualized ureters, and urinary bladder appear unremarkable. Stomach/Bowel: No bowel obstruction or active inflammation. The appendix is normal. Vascular/Lymphatic: The abdominal aorta and IVC unremarkable. No portal venous gas. There is no adenopathy. Reproductive: The uterus is grossly unremarkable. A bilobed cyst or 2 adjacent cysts in the right ovary with combined length of 2.7 cm. No imaging follow-up. No suspicious adnexal masses. Other: None Musculoskeletal: Degenerative changes of spine. No acute osseous pathology. IMPRESSION: 1. No acute intra-abdominal or pelvic pathology. 2. No bowel obstruction. Normal appendix. Electronically Signed   By: Vanetta Chou M.D.   On: 12/29/2023 17:41     Procedures   Medications Ordered in the ED  iohexol  (OMNIPAQUE ) 300 MG/ML solution 100 mL (100 mLs Intravenous Contrast Given 12/29/23 1633)  ondansetron  (ZOFRAN ) injection 4 mg (4 mg Intravenous Given 12/29/23 1702)  morphine  (PF) 4 MG/ML injection 4 mg (4 mg Intravenous Given 12/29/23 1702)  morphine  (PF) 4 MG/ML injection 4 mg (4 mg Intravenous Given 12/29/23 1726)                                    Medical Decision Making Amount and/or Complexity of Data Reviewed Labs: ordered. Radiology:  ordered.  Risk Prescription drug management.   This patient presents to the ED for concern of abdominal pain, this involves an extensive number of treatment options, and is a complaint that carries with it a high risk of complications and morbidity.  The differential diagnosis includes but not limited to diverticulitis, colitis, ovarian pathology, appendicitis    Co morbidities / Chronic conditions that complicate the patient evaluation  Ulcerative colitis, GERD, HTN, HLD   Additional history obtained:  Additional history obtained from EMR External records from outside source obtained and reviewed including prior labs on file.  No prior abdominal imaging.   Lab Tests:  I Ordered, and personally interpreted labs.  The pertinent results include: Lipase normal.  hCG negative.  CMP within normals.  CBC with nonspecific findings.  CT abdomen pelvis with contrast   Imaging Studies ordered:  I ordered  imaging studies including CT a/p  I independently visualized and interpreted imaging which showed right ovarian cyst  I agree with the radiologist interpretation   Problem List / ED Course / Critical interventions / Medication management  53 year old female presents with complaint of pain across her lower abdominal area onset this morning without associated changes in bowel or bladder habits, vaginal discharge, nausea or vomiting.  She is found tender palpation across lower abdomen, worse more on the left side.  She reports history of ulcerative colitis but states this does not feel similar.  Patient was provided with IV narcotic pain medication with improvement in her pain.  Labs are overall reassuring without significant findings today.  CT with small right ovarian cyst.  These results were printed and handed to the patient for her to follow-up with her PCP/gynecologist.  Consider follow-up with GI with concern for her lower abdominal pain without specific findings related today on her ER  workup.  Return to ER for any worsening or concerning symptoms. I ordered medication including morphine , zofran    Reevaluation of the patient after these medicines showed that the patient pain improved  I have reviewed the patients home medicines and have made adjustments as needed   Social Determinants of Health:  Has PCP   Test / Admission - Considered:  Stable for dc      Final diagnoses:  Lower abdominal pain  Cyst of right ovary    ED Discharge Orders     None          Beverley Leita DELENA DEVONNA 12/29/23 1936    Geraldene Hamilton, MD 12/30/23 1806

## 2023-12-29 NOTE — ED Notes (Signed)
 Patient transported to X-ray

## 2023-12-29 NOTE — ED Provider Triage Note (Signed)
 Emergency Medicine Provider Triage Evaluation Note  Suzanne Armstrong , a 53 y.o. female  was evaluated in triage.  Pt complains of groin pain, more like pelvic pain, onset this morning while making coffee. Urinary frequency without dysuria, no changes in bowel habits, no discharge or concern for STI. Hx of UC but does not feel similar.   Review of Systems  Positive:  Negative:   Physical Exam  BP (!) 150/101   Pulse 75   Temp (!) 97.1 F (36.2 C)   Resp 15   Ht 5' (1.524 m)   Wt 96.2 kg   SpO2 100%   BMI 41.40 kg/m  Gen:   Awake, no distress   Resp:  Normal effort  MSK:   Moves extremities without difficulty  Other:  TTP left lower abdomen   Medical Decision Making  Medically screening exam initiated at 2:43 PM.  Appropriate orders placed.  Suzanne Armstrong was informed that the remainder of the evaluation will be completed by another provider, this initial triage assessment does not replace that evaluation, and the importance of remaining in the ED until their evaluation is complete.     Beverley Leita LABOR, PA-C 12/29/23 1444

## 2023-12-29 NOTE — ED Triage Notes (Signed)
 Pt POV shuffling gait- c/o BL groin pain, L worse than right. Pain worse with movement. Denies known injuries, strenuous activity. Denies urinary sx.

## 2024-05-30 ENCOUNTER — Encounter (HOSPITAL_BASED_OUTPATIENT_CLINIC_OR_DEPARTMENT_OTHER): Payer: Self-pay | Admitting: Emergency Medicine

## 2024-05-30 ENCOUNTER — Emergency Department (HOSPITAL_BASED_OUTPATIENT_CLINIC_OR_DEPARTMENT_OTHER)
Admission: EM | Admit: 2024-05-30 | Discharge: 2024-05-30 | Disposition: A | Discharge status: Home / Self Care | Attending: Emergency Medicine | Admitting: Emergency Medicine

## 2024-05-30 ENCOUNTER — Emergency Department (HOSPITAL_BASED_OUTPATIENT_CLINIC_OR_DEPARTMENT_OTHER)

## 2024-05-30 ENCOUNTER — Other Ambulatory Visit: Payer: Self-pay

## 2024-05-30 DIAGNOSIS — R1011 Right upper quadrant pain: Secondary | ICD-10-CM | POA: Insufficient documentation

## 2024-05-30 DIAGNOSIS — Z7982 Long term (current) use of aspirin: Secondary | ICD-10-CM | POA: Diagnosis not present

## 2024-05-30 LAB — URINALYSIS, ROUTINE W REFLEX MICROSCOPIC
Bacteria, UA: NONE SEEN
Bilirubin Urine: NEGATIVE
Glucose, UA: NEGATIVE mg/dL
Hgb urine dipstick: NEGATIVE
Ketones, ur: NEGATIVE mg/dL
Nitrite: NEGATIVE
Protein, ur: NEGATIVE mg/dL
Specific Gravity, Urine: 1.011 (ref 1.005–1.030)
pH: 6.5 (ref 5.0–8.0)

## 2024-05-30 LAB — COMPREHENSIVE METABOLIC PANEL WITH GFR
ALT: 12 U/L (ref 0–44)
AST: 15 U/L (ref 15–41)
Albumin: 4.5 g/dL (ref 3.5–5.0)
Alkaline Phosphatase: 84 U/L (ref 38–126)
Anion gap: 10 (ref 5–15)
BUN: 13 mg/dL (ref 6–20)
CO2: 25 mmol/L (ref 22–32)
Calcium: 9.8 mg/dL (ref 8.9–10.3)
Chloride: 101 mmol/L (ref 98–111)
Creatinine, Ser: 0.97 mg/dL (ref 0.44–1.00)
GFR, Estimated: >60 mL/min (ref >60)
Glucose, Bld: 91 mg/dL (ref 70–99)
Potassium: 4.1 mmol/L (ref 3.5–5.1)
Sodium: 136 mmol/L (ref 135–145)
Total Bilirubin: 0.3 mg/dL (ref 0.0–1.2)
Total Protein: 7.4 g/dL (ref 6.5–8.1)

## 2024-05-30 LAB — CBC
HCT: 37.5 % (ref 36.0–46.0)
Hemoglobin: 11.8 g/dL — ABNORMAL LOW (ref 12.0–15.0)
MCH: 21.9 pg — ABNORMAL LOW (ref 26.0–34.0)
MCHC: 31.5 g/dL (ref 30.0–36.0)
MCV: 69.6 fL — ABNORMAL LOW (ref 80.0–100.0)
Platelets: 265 K/uL (ref 150–400)
RBC: 5.39 MIL/uL — ABNORMAL HIGH (ref 3.87–5.11)
RDW: 16.6 % — ABNORMAL HIGH (ref 11.5–15.5)
WBC: 7.3 K/uL (ref 4.0–10.5)
nRBC: 0 % (ref 0.0–0.2)

## 2024-05-30 LAB — LIPASE, BLOOD: Lipase: 31 U/L (ref 11–51)

## 2024-05-30 MED ORDER — IOHEXOL 300 MG/ML  SOLN
100.0000 mL | Freq: Once | INTRAMUSCULAR | Status: AC | PRN
  Administered 2024-05-30: 100 mL via INTRAVENOUS

## 2024-05-30 MED ORDER — SUCRALFATE 1 G PO TABS: 1.0000 g | ORAL_TABLET | Freq: Three times a day (TID) | ORAL | 1 refills | Status: AC

## 2024-05-30 MED ORDER — POLYETHYLENE GLYCOL 3350 17 GM/SCOOP PO POWD: 17.0000 g | Freq: Two times a day (BID) | ORAL | 0 refills | Status: AC | PRN

## 2024-05-30 NOTE — ED Triage Notes (Signed)
 Pt via POV c/o RUQ abdominal pain x 2 weeks, progressively worsening. No n/v/d. Pt has had gallbladder removed. Prior hx ovarian cysts and pt notes she is perimenopausal.

## 2024-05-30 NOTE — Discharge Instructions (Addendum)
 Please read and follow all provided instructions.  Your diagnoses today include:  1. Right upper quadrant abdominal pain     Tests performed today include: Complete blood cell count: No concerning findings Complete metabolic panel: No concerning findings Urinalysis (urine test): No definitive urine infection CT scan of the abdomen and pelvis: No acute infections or significant problems, does show high stool burden Vital signs. See below for your results today.   Medications prescribed:  Miralax - laxative  This medication can be found over-the-counter.   Carafate  - for stomach upset and to protect your stomach  Take any prescribed medications only as directed.  Home care instructions:  Follow any educational materials contained in this packet.  Follow-up instructions: Please follow-up with your primary care provider in the next 3 days for further evaluation of your symptoms.    Return instructions:  SEEK IMMEDIATE MEDICAL ATTENTION IF: The pain does not go away or becomes severe  A temperature above 101F develops  Repeated vomiting occurs (multiple episodes)  The pain becomes localized to portions of the abdomen. The right side could possibly be appendicitis. In an adult, the left lower portion of the abdomen could be colitis or diverticulitis.  Blood is being passed in stools or vomit (bright red or black tarry stools)  You develop chest pain, difficulty breathing, dizziness or fainting, or become confused, poorly responsive, or inconsolable (young children) If you have any other emergent concerns regarding your health  Additional Information: Abdominal (belly) pain can be caused by many things. Your caregiver performed an examination and possibly ordered blood/urine tests and imaging (CT scan, x-rays, ultrasound). Many cases can be observed and treated at home after initial evaluation in the emergency department. Even though you are being discharged home, abdominal pain can  be unpredictable. Therefore, you need a repeated exam if your pain does not resolve, returns, or worsens. Most patients with abdominal pain don't have to be admitted to the hospital or have surgery, but serious problems like appendicitis and gallbladder attacks can start out as nonspecific pain. Many abdominal conditions cannot be diagnosed in one visit, so follow-up evaluations are very important.  Your vital signs today were: BP (!) 144/86 (BP Location: Right Arm)   Pulse 89   Temp (!) 97.5 F (36.4 C) (Temporal)   Resp 18   Ht 5' (1.524 m)   Wt 97.1 kg   SpO2 100%   BMI 41.79 kg/m  If your blood pressure (bp) was elevated above 135/85 this visit, please have this repeated by your doctor within one month. --------------

## 2024-05-30 NOTE — ED Provider Notes (Signed)
 [place] EMERGENCY DEPARTMENT AT [facility] Provider Note   CSN: [id number] Arrival date & time: 05/30/24  1902     Patient presents with: Abdominal Pain   Suzanne Armstrong is a 53 y.o. female.   Patient with history of cholecystectomy, endometriosis, EGD 03/2023 showing gastritis --presents to the emergency department today for evaluation of worsening right upper quadrant abdominal pain and fullness over the past 2 weeks.  Patient states that the symptoms are generally worse with eating and drinking.  She denies associated nausea and vomiting.  No diarrhea or constipation.  She has taken some medications for gas and constipation without improvement.  No urinary symptoms.  She has perimenopausal.       Prior to Admission medications   Medication Sig Start Date End Date Taking? Authorizing Provider  aspirin  81 MG chewable tablet Chew 1 tablet (81 mg total) by mouth daily. 08/03/20   Donah Riis A, PA-C  carvedilol (COREG) 25 MG tablet Take 25 mg by mouth 2 (two) times daily. 06/18/20   [provider]  EPINEPHrine 0.3 mg/0.3 mL IJ SOAJ injection epinephrine 0.3 mg/0.3 mL injection, auto-injector 05/19/20   [provider]  esomeprazole  (NEXIUM ) 20 MG capsule Take 20 mg by mouth 2 (two) times daily before a meal.    [provider]  famotidine  (PEPCID ) 20 MG tablet Take 1 tablet (20 mg total) by mouth 2 (two) times daily. 10/04/18   Armenta Canning, MD  mesalamine (LIALDA) 1.2 g EC tablet Take 1.2 g by mouth in the morning and at bedtime. 02/08/19   [provider]  methocarbamol  (ROBAXIN ) 500 MG tablet Take 1 tablet (500 mg total) by mouth 2 (two) times daily. 02/08/23   Prosperi, Christian H, PA-C  Multiple Vitamins-Iron (MULTI-VITAMIN/IRON PO) Take 1 capsule by mouth 2 (two) times daily.    [provider]  naproxen  (NAPROSYN ) 500 MG tablet Take 1 tablet (500 mg total) by mouth 2 (two) times daily. 12/11/21   Henderly, Britni A, PA-C   pravastatin (PRAVACHOL) 20 MG tablet Take 20 mg by mouth at bedtime. 06/18/20   [provider]  predniSONE  (DELTASONE ) 10 MG tablet Take 2 tablets (20 mg total) by mouth daily. 11/02/22   Victor Lynwood DASEN, PA-C    Allergies: Other and Strawberry extract    Review of Systems  Updated Vital Signs BP (!) 144/86 (BP Location: Right Arm)   Pulse 89   Temp (!) 97.5 F (36.4 C) (Temporal)   Resp 18   Ht 5' (1.524 m)   Wt 97.1 kg   SpO2 100%   BMI 41.79 kg/m   Physical Exam Vitals and nursing note reviewed.  Constitutional:      General: She is not in acute distress.    Appearance: She is well-developed.  HENT:     Head: Normocephalic and atraumatic.     Right Ear: External ear normal.     Left Ear: External ear normal.     Nose: Nose normal.  Eyes:     Conjunctiva/sclera: Conjunctivae normal.  Cardiovascular:     Rate and Rhythm: Normal rate and regular rhythm.     Heart sounds: No murmur heard. Pulmonary:     Effort: No respiratory distress.     Breath sounds: No wheezing, rhonchi or rales.  Abdominal:     Palpations: Abdomen is soft.     Tenderness: There is abdominal tenderness in the right upper quadrant. There is no guarding or rebound.     Comments: Mild  right quadrant tenderness on deep palpation  Musculoskeletal:     Cervical back: Normal range of motion and neck supple.     Right lower leg: No edema.     Left lower leg: No edema.  Skin:    General: Skin is warm and dry.     Findings: No rash.  Neurological:     General: No focal deficit present.     Mental Status: She is alert. Mental status is at baseline.     Motor: No weakness.  Psychiatric:        Mood and Affect: Mood normal.     (all labs ordered are listed, but only abnormal results are displayed) Labs Reviewed  CBC - Abnormal; Notable for the following components:      Result Value   RBC 5.39 (*)    Hemoglobin 11.8 (*)    MCV 69.6 (*)    MCH 21.9 (*)    RDW 16.6 (*)    All other  components within normal limits  URINALYSIS, ROUTINE W REFLEX MICROSCOPIC - Abnormal; Notable for the following components:   Color, Urine COLORLESS (*)    Leukocytes,Ua TRACE (*)    All other components within normal limits  LIPASE, BLOOD  COMPREHENSIVE METABOLIC PANEL WITH GFR    EKG: None  Radiology: CT ABDOMEN PELVIS W CONTRAST Result Date: 05/30/2024 CLINICAL DATA:  History of prior cholecystectomy presenting with right upper quadrant pain. EXAM: CT ABDOMEN AND PELVIS WITH CONTRAST TECHNIQUE: Multidetector CT imaging of the abdomen and pelvis was performed using the standard protocol following bolus administration of intravenous contrast. RADIATION DOSE REDUCTION: This exam was performed according to the departmental dose-optimization program which includes automated exposure control, adjustment of the mA and/or kV according to patient size and/or use of iterative reconstruction technique. CONTRAST:  OMNIPAQUE  IOHEXOL  300 MG/ML  SOLN COMPARISON:  December 29, 2023 FINDINGS: Lower chest: No acute abnormality. Hepatobiliary: No focal liver abnormality is seen. Status post cholecystectomy. No biliary dilatation. Pancreas: Unremarkable. No pancreatic ductal dilatation or surrounding inflammatory changes. Spleen: Normal in size without focal abnormality. Adrenals/Urinary Tract: Adrenal glands are unremarkable. Kidneys are normal, without renal calculi, focal lesion, or hydronephrosis. Bladder is unremarkable. Stomach/Bowel: Stomach is within normal limits. Appendix appears normal. No evidence of bowel wall thickening, distention, or inflammatory changes. Vascular/Lymphatic: Aortic atherosclerosis. No enlarged abdominal or pelvic lymph nodes. Reproductive: Uterus and bilateral adnexa are unremarkable. Other: No abdominal wall hernia or abnormality. No abdominopelvic ascites. Musculoskeletal: No acute or significant osseous findings. IMPRESSION: 1. Evidence of prior cholecystectomy. 2. Aortic  atherosclerosis. Electronically Signed   By: Suzen Dials M.D.   On: 05/30/2024 21:26     Procedures   Medications Ordered in the ED  iohexol  (OMNIPAQUE ) 300 MG/ML solution 100 mL (has no administration in time range)   ED Course  Patient seen and examined. History obtained directly from patient.   Labs/EKG: Ordered CBC, CMP, lipase, UA..  Imaging: Ordered CT abdomen pelvis.  Medications/Fluids: None ordered  Most recent vital signs reviewed and are as follows: BP (!) 144/86 (BP Location: Right Arm)   Pulse 89   Temp (!) 97.5 F (36.4 C) (Temporal)   Resp 18   Ht 5' (1.524 m)   Wt 97.1 kg   SpO2 100%   BMI 41.79 kg/m   Initial impression: Right upper quadrant abdominal pain, worsening despite treatment over the past 2 weeks.  Imaging ordered for further evaluation.  History of cholecystectomy.  10:58 PM Reassessment performed. Patient appears  stable, comfortable.  Labs personally reviewed and interpreted including: CBC with normal white blood cell count, hemoglobin slightly low at 11.8, microcytic; CMP unremarkable; lipase normal; UA without compelling signs of infection.  Imaging personally visualized and interpreted including: CT of the abdomen pelvis, agree no acute findings which would explain the patient's pain, she does have a fairly pronounced stool burden on my interpretation.  Reviewed pertinent lab work and imaging with patient at bedside. Questions answered.   Most current vital signs reviewed and are as follows: BP 128/69 (BP Location: Right Arm)   Pulse 88   Temp 97.9 F (36.6 C) (Temporal)   Resp 15   Ht 5' (1.524 m)   Wt 97.1 kg   SpO2 100%   BMI 41.79 kg/m   Plan: Discharge to home.   Prescriptions written for: Patient is already on PPI for history of gastritis and will continue this.  She has had some improvement with Carafate  in the past and as her symptoms are worse with eating and drinking, I feel that this is appropriate.  In addition,  we will give MiraLAX, encouraged twice daily use until looser stools.  Other home care instructions discussed:   ED return instructions discussed: The patient was urged to return to the Emergency Department immediately with worsening of current symptoms, worsening abdominal pain, persistent vomiting, blood noted in stools, fever, or any other concerns. The patient verbalized understanding.   Follow-up instructions discussed: Patient encouraged to follow-up with their PCP/GI in 5 days if not improving.                                     Medical Decision Making Amount and/or Complexity of Data Reviewed Labs: ordered. Radiology: ordered.  Risk OTC drugs. Prescription drug management.   For this patient's complaint of abdominal pain, the following conditions were considered on the differential diagnosis: gastritis/PUD, enteritis/duodenitis, appendicitis, cholelithiasis/cholecystitis, cholangitis, pancreatitis, ruptured viscus, colitis, diverticulitis, small/large bowel obstruction, proctitis, cystitis, pyelonephritis, ureteral colic, aortic dissection, aortic aneurysm. In women, ectopic pregnancy, pelvic inflammatory disease, ovarian cysts, and tubo-ovarian abscess were also considered. Atypical chest etiologies were also considered including ACS, PE, and pneumonia.  Lab workup and imaging is reassuring.  Will treat for possibility of constipation exacerbating pain.  Will also have patient continue PPI and add Carafate  given history of gastritis.  The patient's vital signs, pertinent lab work and imaging were reviewed and interpreted as discussed in the ED course. Hospitalization was considered for further testing, treatments, or serial exams/observation. However as patient is well-appearing, has a stable exam, and reassuring studies today, I do not feel that they warrant admission at this time. This plan was discussed with the patient who verbalizes agreement and comfort with this plan and  seems reliable and able to return to the Emergency Department with worsening or changing symptoms.       Final diagnoses:  Right upper quadrant abdominal pain    ED Discharge Orders          Ordered    sucralfate  (CARAFATE ) 1 g tablet  3 times daily with meals & bedtime        05/30/24 2156    polyethylene glycol powder (GLYCOLAX/MIRALAX) 17 GM/SCOOP powder  2 times daily PRN        05/30/24 2156               Anouk Critzer, PA-C 05/30/24 2300  Towana Ozell BROCKS, MD 05/31/24 1020
